# Patient Record
Sex: Female | Born: 1955
Health system: Southern US, Community
[De-identification: ages and names within clinical notes are randomized; demographics above are authoritative.]

## PROBLEM LIST (undated history)

## (undated) DIAGNOSIS — I10 Essential (primary) hypertension: Secondary | ICD-10-CM

---

## 1999-07-14 ENCOUNTER — Emergency Department (HOSPITAL_COMMUNITY): Admission: EM | Admit: 1999-07-14 | Discharge: 1999-07-14 | Payer: Self-pay | Admitting: Emergency Medicine

## 2000-06-23 ENCOUNTER — Emergency Department (HOSPITAL_COMMUNITY): Admission: EM | Admit: 2000-06-23 | Discharge: 2000-06-23 | Payer: Self-pay | Admitting: Emergency Medicine

## 2000-06-23 ENCOUNTER — Encounter: Payer: Self-pay | Admitting: Emergency Medicine

## 2006-09-16 ENCOUNTER — Emergency Department (HOSPITAL_COMMUNITY): Admission: EM | Admit: 2006-09-16 | Discharge: 2006-09-16 | Payer: Self-pay | Admitting: Emergency Medicine

## 2010-11-02 LAB — URIC ACID: Uric Acid, Serum: 5.9

## 2014-09-27 ENCOUNTER — Encounter (HOSPITAL_COMMUNITY): Payer: Self-pay | Admitting: Emergency Medicine

## 2014-09-27 ENCOUNTER — Emergency Department (HOSPITAL_COMMUNITY)
Admission: EM | Admit: 2014-09-27 | Discharge: 2014-09-27 | Disposition: A | Discharge status: Home / Self Care | Payer: Self-pay | Attending: Emergency Medicine | Admitting: Emergency Medicine

## 2014-09-27 DIAGNOSIS — R200 Anesthesia of skin: Secondary | ICD-10-CM | POA: Insufficient documentation

## 2014-09-27 DIAGNOSIS — K0889 Other specified disorders of teeth and supporting structures: Secondary | ICD-10-CM

## 2014-09-27 DIAGNOSIS — Z79899 Other long term (current) drug therapy: Secondary | ICD-10-CM | POA: Insufficient documentation

## 2014-09-27 DIAGNOSIS — I159 Secondary hypertension, unspecified: Secondary | ICD-10-CM | POA: Insufficient documentation

## 2014-09-27 DIAGNOSIS — K088 Other specified disorders of teeth and supporting structures: Secondary | ICD-10-CM | POA: Insufficient documentation

## 2014-09-27 DIAGNOSIS — Z720 Tobacco use: Secondary | ICD-10-CM | POA: Insufficient documentation

## 2014-09-27 DIAGNOSIS — K029 Dental caries, unspecified: Secondary | ICD-10-CM | POA: Insufficient documentation

## 2014-09-27 LAB — I-STAT CHEM 8, ED
BUN: 13 mg/dL (ref 6–20)
Calcium, Ion: 1.18 mmol/L (ref 1.12–1.23)
Chloride: 110 mmol/L (ref 101–111)
Creatinine, Ser: 0.8 mg/dL (ref 0.44–1.00)
Glucose, Bld: 96 mg/dL (ref 65–99)
HCT: 42 % (ref 36.0–46.0)
Hemoglobin: 14.3 g/dL (ref 12.0–15.0)
Potassium: 3.4 mmol/L — ABNORMAL LOW (ref 3.5–5.1)
Sodium: 143 mmol/L (ref 135–145)
TCO2: 19 mmol/L (ref 0–100)

## 2014-09-27 LAB — CBC WITH DIFFERENTIAL/PLATELET
Basophils Absolute: 0 K/uL (ref 0.0–0.1)
Basophils Relative: 0 % (ref 0–1)
Eosinophils Absolute: 0.2 K/uL (ref 0.0–0.7)
Eosinophils Relative: 3 % (ref 0–5)
HCT: 39.1 % (ref 36.0–46.0)
Hemoglobin: 12.7 g/dL (ref 12.0–15.0)
Lymphocytes Relative: 55 % — ABNORMAL HIGH (ref 12–46)
Lymphs Abs: 3.5 K/uL (ref 0.7–4.0)
MCH: 31.5 pg (ref 26.0–34.0)
MCHC: 32.5 g/dL (ref 30.0–36.0)
MCV: 97 fL (ref 78.0–100.0)
Monocytes Absolute: 0.4 K/uL (ref 0.1–1.0)
Monocytes Relative: 6 % (ref 3–12)
Neutro Abs: 2.3 K/uL (ref 1.7–7.7)
Neutrophils Relative %: 36 % — ABNORMAL LOW (ref 43–77)
Platelets: 259 K/uL (ref 150–400)
RBC: 4.03 MIL/uL (ref 3.87–5.11)
RDW: 14.7 % (ref 11.5–15.5)
WBC: 6.4 K/uL (ref 4.0–10.5)

## 2014-09-27 MED ORDER — AMLODIPINE BESYLATE 10 MG PO TABS: 10.0000 mg | ORAL_TABLET | Freq: Every day | ORAL | Status: DC

## 2014-09-27 MED ORDER — OXYCODONE-ACETAMINOPHEN 5-325 MG PO TABS
1.0000 | ORAL_TABLET | Freq: Once | ORAL | Status: AC
  Administered 2014-09-27: 1 via ORAL
  Filled 2014-09-27: qty 1

## 2014-09-27 MED ORDER — PENICILLIN V POTASSIUM 500 MG PO TABS: 500.0000 mg | ORAL_TABLET | Freq: Four times a day (QID) | ORAL | Status: AC

## 2014-09-27 MED ORDER — AMLODIPINE BESYLATE 5 MG PO TABS
10.0000 mg | ORAL_TABLET | Freq: Once | ORAL | Status: AC
  Administered 2014-09-27: 10 mg via ORAL
  Filled 2014-09-27: qty 2

## 2014-09-27 MED ORDER — TRAMADOL HCL 50 MG PO TABS: 50.0000 mg | ORAL_TABLET | Freq: Four times a day (QID) | ORAL | Status: DC | PRN

## 2014-09-27 NOTE — ED Provider Notes (Signed)
CSN: [id number]     Arrival date & time 09/27/14  1122 History  This chart was scribed for non-physician practitioner, Lottie Mussel, PA-C, working with Gilda Crease, MD, by Ronney Lion, ED Scribe. This patient was seen in room TR05C/TR05C and the patient's care was started at 11:41 AM.   Chief Complaint  Patient presents with  . Dental Pain  . Hypertension   The history is provided by the patient. No language interpreter was used.   HPI Comments: Rhonda Best is a 59 y.o. female who presents to the Emergency Department complaining of constant, severe, left lower dental pain that began 1 month ago and has been worsening since.   Regarding patient's blood pressure of 260/127, patient reports she has had a known history of hypertension for the past 30 but has not taken any medications since. She was prescribed Norvasc at that time, but states she did not continue to take them. Patient states she has not seen a doctor in over 20 years. She states she is a current smoker. She denies chest pain or SOB.   She notes numbness over her left shoulder intermittently for the past week.  History reviewed. No pertinent past medical history. History reviewed. No pertinent past surgical history. No family history on file. Social History  Substance Use Topics  . Smoking status: Current Every Day Smoker  . Smokeless tobacco: None  . Alcohol Use: Yes   OB History    No data available     Review of Systems  HENT: Positive for dental problem.   Respiratory: Negative for shortness of breath.   Cardiovascular: Negative for chest pain.  Neurological: Negative for headaches.  All other systems reviewed and are negative.  Allergies  Review of patient's allergies indicates no known allergies.  Home Medications   Prior to Admission medications   Not on File   BP 260/127 mmHg  Pulse 80  Temp(Src) 97.6 F (36.4 C) (Oral)  Resp 16  SpO2 100% Physical Exam  Constitutional: She  is oriented to person, place, and time. She appears well-developed and well-nourished. No distress.  HENT:  Head: Normocephalic and atraumatic.  Right lower first molar decay to the gumline, surrounding, swelling and erythema. No drainage. No obvious abscess. No trismus. A swelling under the tongue.  Eyes: Conjunctivae and EOM are normal.  Neck: Neck supple. No tracheal deviation present.  Cardiovascular: Normal rate, regular rhythm and normal heart sounds.   Pulmonary/Chest: Effort normal and breath sounds normal. No respiratory distress. She has no wheezes. She has no rales.  Musculoskeletal: Normal range of motion. She exhibits no edema.  Neurological: She is alert and oriented to person, place, and time.  Skin: Skin is warm and dry.  Psychiatric: She has a normal mood and affect. Her behavior is normal.  Nursing note and vitals reviewed.   ED Course  Procedures (including critical care time)  DIAGNOSTIC STUDIES: Oxygen Saturation is 100% on RA, normal by my interpretation.    COORDINATION OF CARE: 11:46 AM - Discussed treatment plan with pt at bedside which includes EKG and blood tests to evaluate her hypertension and begin hypertension medication therapy. Lengthy discussion with pt discussing the serious risks of leaving her hypertension uncontrolled, including the possibility of CVA. Will administer one dose of pain medication and hypertension medication here. Will also give Rx antibiotics and dental referral for dental pain. Pt verbalized understanding and agreed to plan.    Labs Review Labs Reviewed  CBC WITH DIFFERENTIAL/PLATELET -  Abnormal; Notable for the following:    Neutrophils Relative % 36 (*)    Lymphocytes Relative 55 (*)    All other components within normal limits  I-STAT CHEM 8, ED - Abnormal; Notable for the following:    Potassium 3.4 (*)    All other components within normal limits    Imaging Review No results found. I have personally reviewed and  evaluated these images and lab results as part of my medical decision-making.   EKG Interpretation None      MDM   Final diagnoses:  Pain, dental  Secondary hypertension, unspecified   Patient is an emergency department for dental pain. When vital signs obtained, showed the patient's blood pressure is 260/127. Rechecked, the same. Patient states she has not been to primary care doctor in 30 years. She states last time she went she was placed on Norvasc, but she states she never went back. Patient denies any associated symptoms, denies any chest pain, headache, swelling, shortness of breath, malaise. Her tooth does look like she might have an early infection. Will start on penicillin, tramadol for pain. I discussed patient's blood pressure Dr. Blinda Leatherwood. Will get CBC, metabolic panel EKG. Norvasc ordered 10 mg for blood pressure.  Patient's electrolytes, renal function, blood counts are unremarkable. Her EKG shows no acute findings. This is asymptomatic hypertension. No emergent treatment indicated. Rechecked blood pressure is 227/98. Discussed with Dr. Blinda Leatherwood again, patient will be discharged home, with Norvasc, and follow-up with primary care doctor. Resources provided. Return precautions discussed. Also discussed diet, and the importance of following up.  Filed Vitals:   09/27/14 1258  BP: 227/98  Pulse: 78  Temp: 98.2 F (36.8 C)  Resp: 16   I personally performed the services described in this documentation, which was scribed in my presence. The recorded information has been reviewed and is accurate.    Jaynie Crumble, PA-C 09/27/14 1638  Gilda Crease, MD 09/28/14 [phone]

## 2014-09-27 NOTE — ED Notes (Signed)
Left lower dental pain on and off x 1 month. Worse since yesterday.

## 2014-09-27 NOTE — Discharge Instructions (Signed)
Takes Norvasc daily for your high blood pressure. Please follow-up with the primary care doctor for recheck in 2 weeks. Watch your diet.  Take penicillin as prescribed until all gone for a dental infection. Tramadol for pain, he can also take Tylenol for additional pain relief. Please follow-up with a dentist.   Dental Pain A tooth ache may be caused by cavities (tooth decay). Cavities expose the nerve of the tooth to air and hot or cold temperatures. It may come from an infection or abscess (also called a boil or furuncle) around your tooth. It is also often caused by dental caries (tooth decay). This causes the pain you are having. DIAGNOSIS  Your caregiver can diagnose this problem by exam. TREATMENT   If caused by an infection, it may be treated with medications which kill germs (antibiotics) and pain medications as prescribed by your caregiver. Take medications as directed.  Only take over-the-counter or prescription medicines for pain, discomfort, or fever as directed by your caregiver.  Whether the tooth ache today is caused by infection or dental disease, you should see your dentist as soon as possible for further care. SEEK MEDICAL CARE IF: The exam and treatment you received today has been provided on an emergency basis only. This is not a substitute for complete medical or dental care. If your problem worsens or new problems (symptoms) appear, and you are unable to meet with your dentist, call or return to this location. SEEK IMMEDIATE MEDICAL CARE IF:   You have a fever.  You develop redness and swelling of your face, jaw, or neck.  You are unable to open your mouth.  You have severe pain uncontrolled by pain medicine. MAKE SURE YOU:   Understand these instructions.  Will watch your condition.  Will get help right away if you are not doing well or get worse. Document Released: 01/07/2005 Document Revised: 04/01/2011 Document Reviewed: 08/26/2007 [facility] Patient  Information 2015 [place], [place]. This information is not intended to replace advice given to you by your health care provider. Make sure you discuss any questions you have with your health care provider.  Hypertension Hypertension, commonly called high blood pressure, is when the force of blood pumping through your arteries is too strong. Your arteries are the blood vessels that carry blood from your heart throughout your body. A blood pressure reading consists of a higher number over a lower number, such as 110/72. The higher number (systolic) is the pressure inside your arteries when your heart pumps. The lower number (diastolic) is the pressure inside your arteries when your heart relaxes. Ideally you want your blood pressure below 120/80. Hypertension forces your heart to work harder to pump blood. Your arteries may become narrow or stiff. Having hypertension puts you at risk for heart disease, stroke, and other problems.  RISK FACTORS Some risk factors for high blood pressure are controllable. Others are not.  Risk factors you cannot control include:   Race. You may be at higher risk if you are African American.  Age. Risk increases with age.  Gender. Men are at higher risk than women before age 3 years. After age 48, women are at higher risk than men. Risk factors you can control include:  Not getting enough exercise or physical activity.  Being overweight.  Getting too much fat, sugar, calories, or salt in your diet.  Drinking too much alcohol. SIGNS AND SYMPTOMS Hypertension does not usually cause signs or symptoms. Extremely high blood pressure (hypertensive crisis) may cause headache,  anxiety, shortness of breath, and nosebleed. DIAGNOSIS  To check if you have hypertension, your health care provider will measure your blood pressure while you are seated, with your arm held at the level of your heart. It should be measured at least twice using the same arm. Certain conditions can  cause a difference in blood pressure between your right and left arms. A blood pressure reading that is higher than normal on one occasion does not mean that you need treatment. If one blood pressure reading is high, ask your health care provider about having it checked again. TREATMENT  Treating high blood pressure includes making lifestyle changes and possibly taking medicine. Living a healthy lifestyle can help lower high blood pressure. You may need to change some of your habits. Lifestyle changes may include:  Following the DASH diet. This diet is high in fruits, vegetables, and whole grains. It is low in salt, red meat, and added sugars.  Getting at least 2 hours of brisk physical activity every week.  Losing weight if necessary.  Not smoking.  Limiting alcoholic beverages.  Learning ways to reduce stress. If lifestyle changes are not enough to get your blood pressure under control, your health care provider may prescribe medicine. You may need to take more than one. Work closely with your health care provider to understand the risks and benefits. HOME CARE INSTRUCTIONS  Have your blood pressure rechecked as directed by your health care provider.   Take medicines only as directed by your health care provider. Follow the directions carefully. Blood pressure medicines must be taken as prescribed. The medicine does not work as well when you skip doses. Skipping doses also puts you at risk for problems.   Do not smoke.   Monitor your blood pressure at home as directed by your health care provider. SEEK MEDICAL CARE IF:   You think you are having a reaction to medicines taken.  You have recurrent headaches or feel dizzy.  You have swelling in your ankles.  You have trouble with your vision. SEEK IMMEDIATE MEDICAL CARE IF:  You develop a severe headache or confusion.  You have unusual weakness, numbness, or feel faint.  You have severe chest or abdominal pain.  You  vomit repeatedly.  You have trouble breathing. MAKE SURE YOU:   Understand these instructions.  Will watch your condition.  Will get help right away if you are not doing well or get worse. Document Released: 01/07/2005 Document Revised: 05/24/2013 Document Reviewed: 10/30/2012 [facility] Patient Information 2015 [place], [place]. This information is not intended to replace advice given to you by your health care provider. Make sure you discuss any questions you have with your health care provider.  DASH Eating Plan DASH stands for "Dietary Approaches to Stop Hypertension." The DASH eating plan is a healthy eating plan that has been shown to reduce high blood pressure (hypertension). Additional health benefits may include reducing the risk of type 2 diabetes mellitus, heart disease, and stroke. The DASH eating plan may also help with weight loss. WHAT DO I NEED TO KNOW ABOUT THE DASH EATING PLAN? For the DASH eating plan, you will follow these general guidelines:  Choose foods with a percent daily value for sodium of less than 5% (as listed on the food label).  Use salt-free seasonings or herbs instead of table salt or sea salt.  Check with your health care provider or pharmacist before using salt substitutes.  Eat lower-sodium products, often labeled as "lower sodium" or "no  salt added."  Eat fresh foods.  Eat more vegetables, fruits, and low-fat dairy products.  Choose whole grains. Look for the word "whole" as the first word in the ingredient list.  Choose fish and skinless chicken or [place] more often than red meat. Limit fish, poultry, and meat to 6 oz (170 g) each day.  Limit sweets, desserts, sugars, and sugary drinks.  Choose heart-healthy fats.  Limit cheese to 1 oz (28 g) per day.  Eat more home-cooked food and less restaurant, buffet, and fast food.  Limit fried foods.  Cook foods using methods other than frying.  Limit canned vegetables. If you do use them, rinse  them well to decrease the sodium.  When eating at a restaurant, ask that your food be prepared with less salt, or no salt if possible. WHAT FOODS CAN I EAT? Seek help from a dietitian for individual calorie needs. Grains Whole grain or whole wheat bread. Brown rice. Whole grain or whole wheat pasta. Quinoa, bulgur, and whole grain cereals. Low-sodium cereals. Corn or whole wheat flour tortillas. Whole grain cornbread. Whole grain crackers. Low-sodium crackers. Vegetables Fresh or frozen vegetables (raw, steamed, roasted, or grilled). Low-sodium or reduced-sodium tomato and vegetable juices. Low-sodium or reduced-sodium tomato sauce and paste. Low-sodium or reduced-sodium canned vegetables.  Fruits All fresh, canned (in natural juice), or frozen fruits. Meat and Other Protein Products Ground beef (85% or leaner), grass-fed beef, or beef trimmed of fat. Skinless chicken or [place]. Ground chicken or [place]. Pork trimmed of fat. All fish and seafood. Eggs. Dried beans, peas, or lentils. Unsalted nuts and seeds. Unsalted canned beans. Dairy Low-fat dairy products, such as skim or 1% milk, 2% or reduced-fat cheeses, low-fat ricotta or cottage cheese, or plain low-fat yogurt. Low-sodium or reduced-sodium cheeses. Fats and Oils Tub margarines without trans fats. Light or reduced-fat mayonnaise and salad dressings (reduced sodium). Avocado. Safflower, olive, or canola oils. Natural peanut or almond butter. Other Unsalted popcorn and pretzels. The items listed above may not be a complete list of recommended foods or beverages. Contact your dietitian for more options. WHAT FOODS ARE NOT RECOMMENDED? Grains White bread. White pasta. White rice. Refined cornbread. Bagels and croissants. Crackers that contain trans fat. Vegetables Creamed or fried vegetables. Vegetables in a cheese sauce. Regular canned vegetables. Regular canned tomato sauce and paste. Regular tomato and vegetable juices. Fruits Dried  fruits. Canned fruit in light or heavy syrup. Fruit juice. Meat and Other Protein Products Fatty cuts of meat. Ribs, chicken wings, bacon, sausage, bologna, salami, chitterlings, fatback, hot dogs, bratwurst, and packaged luncheon meats. Salted nuts and seeds. Canned beans with salt. Dairy Whole or 2% milk, cream, half-and-half, and cream cheese. Whole-fat or sweetened yogurt. Full-fat cheeses or blue cheese. Nondairy creamers and whipped toppings. Processed cheese, cheese spreads, or cheese curds. Condiments Onion and garlic salt, seasoned salt, table salt, and sea salt. Canned and packaged gravies. Worcestershire sauce. Tartar sauce. Barbecue sauce. Teriyaki sauce. Soy sauce, including reduced sodium. Steak sauce. Fish sauce. Oyster sauce. Cocktail sauce. Horseradish. Ketchup and mustard. Meat flavorings and tenderizers. Bouillon cubes. Hot sauce. Tabasco sauce. Marinades. Taco seasonings. Relishes. Fats and Oils Butter, stick margarine, lard, shortening, ghee, and bacon fat. Coconut, palm kernel, or palm oils. Regular salad dressings. Other Pickles and olives. Salted popcorn and pretzels. The items listed above may not be a complete list of foods and beverages to avoid. Contact your dietitian for more information. WHERE CAN I FIND MORE INFORMATION? National Heart, Lung, and Blood Institute: [employer].it  Document Released: 12/27/2010 Document Revised: 05/24/2013 Document Reviewed: 11/11/2012 [facility] Patient Information 2015 [place], [place]. This information is not intended to replace advice given to you by your health care provider. Make sure you discuss any questions you have with your health care provider.   Emergency Department Resource Guide 1) Find a Doctor and Pay Out of Pocket Although you won't have to find out who is covered by your insurance plan, it is a good idea to ask around and get recommendations. You will then need to call the office and  see if the doctor you have chosen will accept you as a new patient and what types of options they offer for patients who are self-pay. Some doctors offer discounts or will set up payment plans for their patients who do not have insurance, but you will need to ask so you aren't surprised when you get to your appointment.  2) Contact Your Local Health Department Not all health departments have doctors that can see patients for sick visits, but many do, so it is worth a call to see if yours does. If you don't know where your local health department is, you can check in your phone book. The CDC also has a tool to help you locate your state's health department, and many state websites also have listings of all of their local health departments.  3) Find a Walk-in Clinic If your illness is not likely to be very severe or complicated, you may want to try a walk in clinic. These are popping up all over the country in pharmacies, drugstores, and shopping centers. They're usually staffed by nurse practitioners or physician assistants that have been trained to treat common illnesses and complaints. They're usually fairly quick and inexpensive. However, if you have serious medical issues or chronic medical problems, these are probably not your best option.  No Primary Care Doctor: - Call Health Connect at  [phone] - they can help you locate a primary care doctor that  accepts your insurance, provides certain services, etc. - Physician Referral Service- [phone]  Chronic Pain Problems: Organization         Address  Phone   Notes  [facility]  [phone] Patients need to be referred by their primary care doctor.   Medication Assistance: Organization         Address  Phone   Notes  [facility] Medication [facility] [address] [place], [place] [address] [phone] --Must be a resident of [facility] -- Must have NO insurance coverage whatsoever  (no Medicaid/ Medicare, etc.) -- The pt. MUST have a primary care doctor that directs their care regularly and follows them in the community   [facility]ist  [phone]   [employer]  [phone]    Agencies that provide inexpensive medical care: Organization         Address  Phone   Notes  [facility]  [phone]   [facility]    [phone]   [facility] [address] [place], [place] [address] [phone]   [facility] [address] [place], [place] [phone]   Planned Parenthood    [phone]   [facility]    [phone]   Community [facility] and [facility]  [address],  Phone:  [phone], Fax:  [phone] Hours of Operation:  9 am - 6 pm, M-F.  Also accepts Medicaid/Medicare and self-pay.  [facility]  [address], [place] Phone: [phone], Fax: [phone]. Hours of Operation:  8:30 am - 5:30 pm, M-F.  Also accepts Medicaid and self-pay.  [facility] [place] [address] [place] Phone: [phone]   [facility] [address] Natasha Bence [place], [place] [phone], Ext. [phone] Mondays & Thursdays: 7-9 AM.  First 15 patients are seen on a first come, first serve basis.    Medicaid-accepting [facility] Providers:  Organization         Address  Phone   Notes  [facility] [address], [place] [phone] Also accepts self-pay patients.  [facility] [address] Laurell Josephs [place], [place]  [phone]   [facility] [address], [place] [phone]   [facility] [address] [place] [phone]   Renaye Rakers [address], [place]   [phone] Only accepts [place] Access [place] patients after they have their name applied to their  card.   Self-Pay (no insurance) in [facility]:  Organization         Address  Phone   Notes  Sickle Cell Patients, [facility] [address] [place], [place] [phone]   [facility] [address] [place], [place] [phone]   [facility] [place]  [address], [place] [phone]   [facility]/Dr. Osei-Bonsu  [address], [place] or [address], [place] [phone] Phone number for both [place] and [place] locations is the same.  [facility]gent [facility] and [facility] [address] [place] [phone]   [facility] [address] [place] or [address] [phone]   [facility] [address] [place] [phone], phone; [phone], fax Sees patients 1st and 3rd Saturday of every month.  Must not qualify for public or private insurance (i.e. Medicaid, Medicare, [place] Health Choice, Veterans' Benefits)  Household income should be no more than 200% of the poverty level The clinic cannot treat you if you are pregnant or think you are pregnant  Sexually transmitted diseases are not treated at the clinic.    Dental Care: Organization         Address  Phone  Notes  [facility] [place] Department of [facility] [address] [place], [place] [phone] Accepts children up to age 62 who are enrolled in [place] or [place] Health Choice; pregnant women with a Medicaid card; and children who have applied for Medicaid or [place] Health Choice, but were declined, whose parents can pay a reduced fee at time of service.  [facility] Department of [facility]  [address], [place] [phone] Accepts children up to age 65 who are enrolled in [place] or [place] Health Choice; pregnant women with a Medicaid card; and children who have applied for Medicaid or [place] Health  Choice, but were declined, whose parents can pay a reduced fee at time of service.  [facility]  [address] [place], [place] [phone] Patients are seen by appointment only. Walk-ins are not accepted. [facility] will see patients 76 years of age and older. Monday - Tuesday (  8am-5pm) Most Wednesdays (8:30-5pm) $30 per visit, cash only  [facility] Access [facility]  [address], [facility] [phone] Patients are seen by appointment only. Walk-ins are not accepted. [facility] will see patients 62 years of age and older. One Wednesday Evening (Monthly: Volunteer Based).  $30 per visit, cash only  [employer]  [phone] for adults; Children under age 37, call Graduate Pediatric Dentistry at [phone]. Children aged 8-14, please call [phone] to request a pediatric application.  Dental services are provided in all areas of dental care including fillings, crowns and bridges, complete and partial dentures, implants, gum treatment, root canals, and extractions. Preventive care is also provided. Treatment is provided to both adults and children. Patients are selected via a lottery and there is often a waiting list.   [facility] [address], [place]  [phone] [email or web address]   [facility] [address] [place], [place] [phone], Ext. [phone] Second and Fourth Thursday of each month, opens at 6:30 AM; Clinic ends at 9 AM.  Patients are seen on a first-come first-served basis, and a limited number are seen during each clinic.   [facility]  [address] Ether Griffins [place], [place] [phone]   Eligibility Requirements You must have lived in [place], [place], or [place] counties for at least the last three months.   You cannot be eligible for state or federal sponsored [place], including [employer], [place], or [employer].   You  generally cannot be eligible for healthcare insurance through your employer.    How to apply: Eligibility screenings are held every Tuesday and Wednesday afternoon from 1:00 pm until 4:00 pm. You do not need an appointment for the interview!  [facility] [address], [place], [place] [phone]   [facility] Health Department  [phone]   [facility] Health Department  [phone]   [facility] Health Department  [phone]    Behavioral Health Resources in the Community: Intensive Outpatient Programs Organization         Address  Phone  Notes  [facility] [address] [place], [place] [phone]   [facility] [address], [place], [place] [phone]   [facility] [address], [place], [place]  [phone]   [facility] Mental Health [address],  [place], [place] [phone] or [phone]   Substance Abuse Resources Organization         Address  Phone  Notes  [facility] Drug Services  [phone]   [facility]  [phone]   [facility]  [phone]   Floydene Flock  [phone]   Residential & Outpatient Substance Abuse Program  [phone]   Psychological Services Organization         Address  Phone  Notes  [facility]  [phone]   [facility]  [phone]   [facility] Mental Health [address], [place] [phone] or [phone]    Mobile Crisis Teams Organization         Address  Phone  Notes  [facility], Mobile Crisis Care Unit  [phone]   [facility]  [address] [place], [place] [phone]   Doristine Locks [address] [place] [phone]    Self-Help/Support Groups Organization         Address  Phone  Notes  [facility] [place] - variety of support groups  336- I7437963 Call for  more information  Narcotics Anonymous (NA), [facility] [address], [employer] [place]  2 meetings at this location   Statistician         Address  Phone  Notes  [facility] [address],    [place]  [phone]   [facility]  [address], [place] [address], [place], [place] [phone]   [facility] [address] [place], [place] [phone] Admissions: 8am-3pm M-F  [facility] [address]    [place], [place] [phone]   [facility] [address] [place], [place], [place] [phone]   [facility] [address],  [place], [place] [phone]   [facility] - Intensive Outpatient [address] Laurell Josephs [address], [place], [place] [phone]   [facility] ([facility] [address] [place]  [place], [place] [phone] or [phone]   [facility] ([facility]) [address], [place], [place] [phone] Accepts Medicaid  [facility] [place] [address]  [place] [phone] Substance Abuse/Addiction Treatment   [facility] Organization         Address  Phone  Notes  [facility]  9043624561   Angie Fava, PhD [address] Ervin Knack [place], [place]   [phone] or [phone]   [facility]   [address] [place], [place] [phone]   [facility] [address] [place], [place] [phone] Insurance/Medicaid/sponsorship through [facility] [address]                                    [place], [place] [phone] Therapy/tele-psych/case  [facility] [address], [place] [phone]    Dr. Lolly [place]  [id number]   [facility]  [employer] [facility] Dept. 1) [address] [place] 2) [address], [place] 3)  [address], [place] [phone]  [phone]   [facility] Child Abuse Hotline [phone] or [phone] (After Hours)

## 2015-06-05 ENCOUNTER — Emergency Department (HOSPITAL_COMMUNITY): Payer: Self-pay

## 2015-06-05 ENCOUNTER — Emergency Department (HOSPITAL_COMMUNITY)
Admission: EM | Admit: 2015-06-05 | Discharge: 2015-06-06 | Disposition: A | Discharge status: Home / Self Care | Payer: Self-pay | Attending: Emergency Medicine | Admitting: Emergency Medicine

## 2015-06-05 DIAGNOSIS — M25472 Effusion, left ankle: Secondary | ICD-10-CM | POA: Insufficient documentation

## 2015-06-05 DIAGNOSIS — R200 Anesthesia of skin: Secondary | ICD-10-CM | POA: Insufficient documentation

## 2015-06-05 DIAGNOSIS — F17200 Nicotine dependence, unspecified, uncomplicated: Secondary | ICD-10-CM | POA: Insufficient documentation

## 2015-06-05 DIAGNOSIS — M79662 Pain in left lower leg: Secondary | ICD-10-CM | POA: Insufficient documentation

## 2015-06-05 DIAGNOSIS — M7989 Other specified soft tissue disorders: Secondary | ICD-10-CM | POA: Insufficient documentation

## 2015-06-05 DIAGNOSIS — Z79899 Other long term (current) drug therapy: Secondary | ICD-10-CM | POA: Insufficient documentation

## 2015-06-05 DIAGNOSIS — R600 Localized edema: Secondary | ICD-10-CM | POA: Insufficient documentation

## 2015-06-05 DIAGNOSIS — M79672 Pain in left foot: Secondary | ICD-10-CM | POA: Insufficient documentation

## 2015-06-05 LAB — CBC WITH DIFFERENTIAL/PLATELET
Basophils Absolute: 0 10*3/uL (ref 0.0–0.1)
Basophils Relative: 0 %
EOS PCT: 4 %
Eosinophils Absolute: 0.2 10*3/uL (ref 0.0–0.7)
HCT: 39 % (ref 36.0–46.0)
Hemoglobin: 13 g/dL (ref 12.0–15.0)
LYMPHS ABS: 4 10*3/uL (ref 0.7–4.0)
LYMPHS PCT: 69 %
MCH: 31.7 pg (ref 26.0–34.0)
MCHC: 33.3 g/dL (ref 30.0–36.0)
MCV: 95.1 fL (ref 78.0–100.0)
Monocytes Absolute: 0.4 10*3/uL (ref 0.1–1.0)
Monocytes Relative: 7 %
Neutro Abs: 1.1 10*3/uL — ABNORMAL LOW (ref 1.7–7.7)
Neutrophils Relative %: 20 %
PLATELETS: 290 10*3/uL (ref 150–400)
RBC: 4.1 MIL/uL (ref 3.87–5.11)
RDW: 15.8 % — ABNORMAL HIGH (ref 11.5–15.5)
WBC: 5.7 10*3/uL (ref 4.0–10.5)

## 2015-06-05 LAB — BASIC METABOLIC PANEL
Anion gap: 9 (ref 5–15)
BUN: 14 mg/dL (ref 6–20)
CHLORIDE: 109 mmol/L (ref 101–111)
CO2: 22 mmol/L (ref 22–32)
Calcium: 9 mg/dL (ref 8.9–10.3)
Creatinine, Ser: 0.89 mg/dL (ref 0.44–1.00)
GFR calc Af Amer: >60 mL/min (ref >60)
GFR calc non Af Amer: >60 mL/min (ref >60)
GLUCOSE: 89 mg/dL (ref 65–99)
POTASSIUM: 3.6 mmol/L (ref 3.5–5.1)
SODIUM: 140 mmol/L (ref 135–145)

## 2015-06-05 LAB — D-DIMER, QUANTITATIVE: D-Dimer, Quant: <0.27 ug/mL-FEU (ref 0.00–0.50)

## 2015-06-05 NOTE — ED Provider Notes (Signed)
CSN: [id number]     Arrival date & time 06/05/15  1922 History  By signing my name below, I, Rhonda Best, attest that this documentation has been prepared under the direction and in the presence of non-physician practitioner, Felicie Mornavid Aldene Hendon, NP. Electronically Signed: Linna Darnerussell Best, Scribe. 06/05/2015. 8:37 PM.   Chief Complaint  Patient presents with  . Ankle Pain    Patient is a 60 y.o. female presenting with ankle pain. The history is provided by the patient. No language interpreter was used.  Ankle Pain Location:  Ankle and foot Time since incident:  3 weeks Injury: no   Ankle location:  L ankle Foot location:  L foot and dorsum of L foot Pain details:    Severity:  Severe   Onset quality:  Sudden   Duration:  3 weeks   Timing:  Constant   Progression:  Worsening Chronicity:  New Dislocation: no   Foreign body present:  No foreign bodies Tetanus status:  Unknown Prior injury to area:  No Worsened by:  Bearing weight Associated symptoms: swelling   Associated symptoms: no neck pain      HPI Comments: Rhonda Best is a 60 y.o. female with no significant PMHx who presents to the Emergency Department complaining of constant, severe, worsening, left ankle pain and swelling for the last three weeks. Pt reports that she worked for 22 days straight and her symptoms presented afterwards; she is on her feet often for work. Pt also endorses left foot pain and swelling since onset. Pt reports that her swelling reduces minimally at night. She endorses severe pain exacerbation with ambulation, weight bearing, and standing; she states that for the last three days she has experienced significant pain throughout her lower extremities bilaterally upon standing. She denies recent injury. Pt denies pain exacerbation with palpation to her left foot or left ankle. She further denies pain at the bottom of her left foot, neuro deficits, or any other associated symptoms  Pt additionally notes  sudden onset, intermittent, numbness localized in her upper trapezius for the last few days. She denies weakness in her bilateral arms, neck pain, or any other associated symptoms.  No past medical history on file. No past surgical history on file. No family history on file. Social History  Substance Use Topics  . Smoking status: Current Every Day Smoker  . Smokeless tobacco: Not on file  . Alcohol Use: Yes   OB History    No data available     Review of Systems  Musculoskeletal: Positive for joint swelling (left foot, left ankle) and arthralgias (left foot, left ankle). Negative for neck pain.  Neurological: Positive for numbness (upper trapezius). Negative for weakness.  All other systems reviewed and are negative.   Allergies  Review of patient's allergies indicates no known allergies.  Home Medications   Prior to Admission medications   Medication Sig Start Date End Date Taking? Authorizing Provider  amLODipine (NORVASC) 10 MG tablet Take 1 tablet (10 mg total) by mouth daily. 09/27/14   Tatyana Kirichenko, PA-C  traMADol (ULTRAM) 50 MG tablet Take 1 tablet (50 mg total) by mouth every 6 (six) hours as needed. 09/27/14   Tatyana Kirichenko, PA-C   BP 185/95 mmHg  Pulse 75  Temp(Src) 98.3 F (36.8 C) (Oral)  Resp 16  Ht 5\' 3"  (1.6 m)  Wt 178 lb (80.74 kg)  BMI 31.54 kg/m2  SpO2 97% Physical Exam  Constitutional: She is oriented to person, place, and time. She appears well-developed  and well-nourished. No distress.  HENT:  Head: Normocephalic and atraumatic.  Eyes: Conjunctivae and EOM are normal.  Neck: Neck supple. No tracheal deviation present.  Cardiovascular: Normal rate.   Pulmonary/Chest: Effort normal. No respiratory distress.  Musculoskeletal:  Swelling to left foot and ankle.  Tenderness at the base of the left calf.  Neurological: She is alert and oriented to person, place, and time.  Skin: Skin is warm and dry.  Psychiatric: She has a normal mood and  affect. Her behavior is normal.  Nursing note and vitals reviewed.   ED Course  Procedures (including critical care time)  DIAGNOSTIC STUDIES: Oxygen Saturation is 97% on RA, normal by my interpretation.    COORDINATION OF CARE: 8:37 PM Discussed treatment plan with pt at bedside and pt agreed to plan.  Labs Review Labs Reviewed  CBC WITH DIFFERENTIAL/PLATELET - Abnormal; Notable for the following:    RDW 15.8 (*)    Neutro Abs 1.1 (*)    All other components within normal limits  D-DIMER, QUANTITATIVE (NOT AT [facility])  BASIC METABOLIC PANEL    Imaging Review Dg Foot Complete Left  06/06/2015  CLINICAL DATA:  Left dorsal foot pain X 2 months. No specific injury, pt states she has worked 20 days straight and been on her feet too much. EXAM: LEFT FOOT - COMPLETE 3+ VIEW COMPARISON:  None. FINDINGS: There is no evidence of fracture or dislocation. There is no evidence of arthropathy or other focal bone abnormality. Soft tissues are unremarkable. IMPRESSION: Negative. Electronically Signed   By: Burman Nieves M.D.   On: 06/06/2015 00:17   I have personally reviewed and evaluated these images and lab results as part of my medical decision-making.   EKG Interpretation None     Radiology and lab results reviewed and shared with patient. MDM   Final diagnoses:  None  Left foot edema and pain. Negative d-dimer. Care instructions provided. Return precautions discussed.  I personally performed the services described in this documentation, which was scribed in my presence. The recorded information has been reviewed and is accurate.   Felicie Morn, NP 06/06/15 0200  Benjiman Core, MD 06/06/15 2352

## 2015-06-05 NOTE — ED Notes (Signed)
Ambulatory to triage. Pt states on and off swelling and pain in the left ankle. She says she worked 22 days straight and it started hurting after that. Denies specific injury.

## 2015-06-06 MED ORDER — NAPROXEN 500 MG PO TABS: 500.0000 mg | ORAL_TABLET | Freq: Two times a day (BID) | ORAL | Status: DC

## 2015-06-06 MED ORDER — MEDICAL COMPRESSION STOCKINGS MISC: 1.0000 | Freq: Every day | Status: DC

## 2015-06-06 NOTE — Discharge Instructions (Signed)
Edema Edema is an abnormal buildup of fluids. It is more common in your legs and thighs. Painless swelling of the feet and ankles is more likely as a person ages. It also is common in looser skin, like around your eyes. HOME CARE   Keep the affected body part above the level of the heart while lying down.  Do not sit still or stand for a long time.  Do not put anything right under your knees when you lie down.  Do not wear tight clothes on your upper legs.  Exercise your legs to help the puffiness (swelling) go down.  Wear elastic bandages or support stockings as told by your doctor.  A low-salt diet may help lessen the puffiness.  Only take medicine as told by your doctor. GET HELP IF:  Treatment is not working.  You have heart, liver, or kidney disease and notice that your skin looks puffy or shiny.  You have puffiness in your legs that does not get better when you raise your legs.  You have sudden weight gain for no reason. GET HELP RIGHT AWAY IF:   You have shortness of breath or chest pain.  You cannot breathe when you lie down.  You have pain, redness, or warmth in the areas that are puffy.  You have heart, liver, or kidney disease and get edema all of a sudden.  You have a fever and your symptoms get worse all of a sudden. MAKE SURE YOU:   Understand these instructions.  Will watch your condition.  Will get help right away if you are not doing well or get worse.   This information is not intended to replace advice given to you by your health care provider. Make sure you discuss any questions you have with your health care provider.   Document Released: 06/26/2007 Document Revised: 01/12/2013 Document Reviewed: 10/30/2012 Elsevier Interactive Patient Education 2016 Elsevier Inc.   PLEASE OBTAIN A PAIR OF COMPRESSION STOCKINGS AT THE PHARMACY.

## 2015-10-12 ENCOUNTER — Emergency Department (HOSPITAL_COMMUNITY)
Admission: EM | Admit: 2015-10-12 | Discharge: 2015-10-12 | Disposition: A | Discharge status: Home / Self Care | Payer: Self-pay | Attending: Emergency Medicine | Admitting: Emergency Medicine

## 2015-10-12 ENCOUNTER — Encounter (HOSPITAL_COMMUNITY): Payer: Self-pay | Admitting: Emergency Medicine

## 2015-10-12 DIAGNOSIS — M25572 Pain in left ankle and joints of left foot: Secondary | ICD-10-CM | POA: Insufficient documentation

## 2015-10-12 DIAGNOSIS — Y939 Activity, unspecified: Secondary | ICD-10-CM | POA: Insufficient documentation

## 2015-10-12 DIAGNOSIS — F17200 Nicotine dependence, unspecified, uncomplicated: Secondary | ICD-10-CM | POA: Insufficient documentation

## 2015-10-12 DIAGNOSIS — Y929 Unspecified place or not applicable: Secondary | ICD-10-CM | POA: Insufficient documentation

## 2015-10-12 DIAGNOSIS — I10 Essential (primary) hypertension: Secondary | ICD-10-CM | POA: Insufficient documentation

## 2015-10-12 DIAGNOSIS — X509XXA Other and unspecified overexertion or strenuous movements or postures, initial encounter: Secondary | ICD-10-CM | POA: Insufficient documentation

## 2015-10-12 DIAGNOSIS — Y999 Unspecified external cause status: Secondary | ICD-10-CM | POA: Insufficient documentation

## 2015-10-12 HISTORY — DX: Essential (primary) hypertension: I10

## 2015-10-12 MED ORDER — SODIUM CHLORIDE 0.9 % IV BOLUS (SEPSIS): 30.0000 mL/kg | Freq: Once | INTRAVENOUS | Status: DC

## 2015-10-12 MED ORDER — AMLODIPINE BESYLATE 10 MG PO TABS: 10.0000 mg | ORAL_TABLET | Freq: Every day | ORAL | 2 refills | Status: DC

## 2015-10-12 NOTE — ED Provider Notes (Signed)
[facility] DEPT Provider Note   CSN: [id number] Arrival date & time: 10/12/15  4098     History   Chief Complaint Chief Complaint  Patient presents with  . Joint Swelling    HPI Rhonda Best is a 60 y.o. female.  HPI   60 year old female presents today with left ankle pain. Patient was originally seen on 06/05/2015 approximately 4 months ago for left ankle pain. She reports that she had been working 21 days straight had swelling to the medial aspect of the left ankle, pain with ambulation. Patient was discharged home at that time with musculoskeletal pain. She reports that since then she has continued to work very consistently, notices that swelling gets worse after long. It working, improved with rest and elevation. She denies any fever, warmth to touch, decreased range of motion. She reports she is able to flex and dorsiflex the ankle without significant pain. Patient reports she's feeling otherwise well. She reports history of hypertension, but has run out of her Norvasc 10 mg. She denies any headache, changes in color clarity or characteristics of her urine, chest pain, neurological deficits. Stenting to follow-up with [facility] but was unable to get an appointment. Patient denies any other chronic medical history.      Past Medical History:  Diagnosis Date  . Hypertension     There are no active problems to display for this patient.   History reviewed. No pertinent surgical history.  OB History    No data available       Home Medications    Prior to Admission medications   Medication Sig Start Date End Date Taking? Authorizing Provider  acetaminophen (TYLENOL) 325 MG tablet Take 650 mg by mouth every 6 (six) hours as needed for moderate pain or headache.   Yes Historical Provider, MD  amLODipine (NORVASC) 10 MG tablet Take 1 tablet (10 mg total) by mouth daily. 10/12/15   Eyvonne Mechanic, PA-C  Elastic Bandages & Supports (MEDICAL COMPRESSION  STOCKINGS) MISC 1 Package by Does not apply route daily. Patient not taking: Reported on 10/12/2015 06/06/15   Felicie Morn, NP  naproxen (NAPROSYN) 500 MG tablet Take 1 tablet (500 mg total) by mouth 2 (two) times daily. Patient not taking: Reported on 10/12/2015 06/06/15   Felicie Morn, NP  traMADol (ULTRAM) 50 MG tablet Take 1 tablet (50 mg total) by mouth every 6 (six) hours as needed. Patient not taking: Reported on 10/12/2015 09/27/14   Jaynie Crumble, PA-C    Family History No family history on file.  Social History Social History  Substance Use Topics  . Smoking status: Current Every Day Smoker  . Smokeless tobacco: Never Used  . Alcohol use Yes     Comment: occasionally     Allergies   Review of patient's allergies indicates no known allergies.   Review of Systems Review of Systems  All other systems reviewed and are negative.   Physical Exam Updated Vital Signs BP (!) 211/100 (BP Location: Right Arm) Comment: RN made aware  Pulse 64   Temp 98.2 F (36.8 C) (Oral)   Resp 18   SpO2 100%   Physical Exam  Constitutional: She is oriented to person, place, and time. She appears well-developed and well-nourished.  HENT:  Head: Normocephalic and atraumatic.  Eyes: Conjunctivae are normal. Pupils are equal, round, and reactive to light. Right eye exhibits no discharge. Left eye exhibits no discharge. No scleral icterus.  Neck: Normal range of motion. No JVD present. No  tracheal deviation present.  Pulmonary/Chest: Effort normal. No stridor.  Musculoskeletal:  Obvious swelling to the left ankle, no warmth to touch, full active range of motion, pedal pulses 2+, sensation intact. No redness.  Neurological: She is alert and oriented to person, place, and time. Coordination normal.  Psychiatric: She has a normal mood and affect. Her behavior is normal. Judgment and thought content normal.  Nursing note and vitals reviewed.    ED Treatments / Results  Labs (all labs  ordered are listed, but only abnormal results are displayed) Labs Reviewed - No data to display  EKG  EKG Interpretation None       Radiology No results found.  Procedures Procedures (including critical care time)  Medications Ordered in ED Medications - No data to display   Initial Impression / Assessment and Plan / ED Course  I have reviewed the triage vital signs and the nursing notes.  Pertinent labs & imaging results that were available during my care of the patient were reviewed by me and considered in my medical decision making (see chart for details).  Clinical Course     Final Clinical Impressions(s) / ED Diagnoses   Final diagnoses:  Ankle pain, left  Essential hypertension   Labs:  Imaging:  Consults:  Therapeutics:  Discharge Meds:   Assessment/Plan: 60 year old female presents today with overuse injury to left ankle. She has no signs of infectious etiology on exam, full active range of motion, swelling improved with rest and elevation. Patient will be placed in an ASO, given work note, and instructed to decrease hours at work to help improvement. Patient also presents with hypertension here. She has no complaints, no signs of end organ damage, no concerning characteristics to the further evaluation and management here in the ED. Patient will be given a prescription for her Norvasc 10 mg. Case management consult that he would help arrange outpatient follow-up for hypertension. Patient was given strict return precautions, she verbalized understanding and agreement to today's plan had no further questions or concerns at time of discharge      New Prescriptions Discharge Medication List as of 10/12/2015 11:50 AM       Eyvonne MechanicJeffrey Yul Diana, PA-C 10/12/15 2045    Doug SouSam Jacubowitz, MD 10/13/15 1626

## 2015-10-12 NOTE — Discharge Instructions (Signed)
Please follow-up with [facility] and wellness in the sickle cell clinic for further evaluation and management of your hypertension. Please take medication as directed. He is return the emergency room immediately if he experiences any new or worsening signs or symptoms

## 2015-10-12 NOTE — ED Triage Notes (Signed)
Pt here as a return visit with c/o left ankle swelling-- was seen here for same, referred to health and wellness center-- has not been able to get an appt. Ankle is swollen, no injury, wearing ted hose while at work.  Has NOT taking blood pressure medicine-- Norvasc-- is out of rx.

## 2015-10-12 NOTE — Discharge Planning (Signed)
EDCM consulted to obtain PCP for self pay patient.  EDCM contacted [facility] to set up appointment.  EDCM was advised to call back Monday morning at 9:00 as the new schedule will be available for new patients.  EDCM relayed information to pt and Trey PaulaJeff, [place].

## 2015-10-16 ENCOUNTER — Telehealth: Payer: Self-pay | Admitting: *Deleted

## 2015-10-16 NOTE — Telephone Encounter (Signed)
Rhonda Best J. Lucretia RoersWood, RN, BSN, [place]CM [phone] [facility] with Dr. Julien NordmannLangeland on 10/2 @ 1030.  Spoke with pt at bedside and provided brochure with directions and phone number highlighted.  Pt verbalizes understanding of keeping appointment.

## 2015-10-16 NOTE — Telephone Encounter (Signed)
EDCM called to advise pt [facility]

## 2015-10-23 ENCOUNTER — Encounter: Payer: Self-pay | Admitting: Internal Medicine

## 2015-10-23 ENCOUNTER — Ambulatory Visit: Payer: Self-pay | Attending: Internal Medicine | Admitting: Internal Medicine

## 2015-10-23 VITALS — BP 158/99 | HR 69 | Temp 97.8°F | Resp 16 | Wt 180.4 lb

## 2015-10-23 DIAGNOSIS — Z1159 Encounter for screening for other viral diseases: Secondary | ICD-10-CM | POA: Insufficient documentation

## 2015-10-23 DIAGNOSIS — F17200 Nicotine dependence, unspecified, uncomplicated: Secondary | ICD-10-CM

## 2015-10-23 DIAGNOSIS — F17210 Nicotine dependence, cigarettes, uncomplicated: Secondary | ICD-10-CM | POA: Insufficient documentation

## 2015-10-23 DIAGNOSIS — Z114 Encounter for screening for human immunodeficiency virus [HIV]: Secondary | ICD-10-CM

## 2015-10-23 DIAGNOSIS — H6121 Impacted cerumen, right ear: Secondary | ICD-10-CM

## 2015-10-23 DIAGNOSIS — M25572 Pain in left ankle and joints of left foot: Secondary | ICD-10-CM | POA: Insufficient documentation

## 2015-10-23 DIAGNOSIS — Z0000 Encounter for general adult medical examination without abnormal findings: Secondary | ICD-10-CM | POA: Insufficient documentation

## 2015-10-23 DIAGNOSIS — I10 Essential (primary) hypertension: Secondary | ICD-10-CM | POA: Insufficient documentation

## 2015-10-23 LAB — CMP AND LIVER
ALT: 19 U/L (ref 6–29)
AST: 21 U/L (ref 10–35)
Albumin: 4.2 g/dL (ref 3.6–5.1)
Alkaline Phosphatase: 70 U/L (ref 33–130)
BILIRUBIN DIRECT: 0.1 mg/dL (ref <=0.2)
BILIRUBIN INDIRECT: 0.3 mg/dL (ref 0.2–1.2)
BUN: 15 mg/dL (ref 7–25)
CALCIUM: 9.3 mg/dL (ref 8.6–10.4)
CHLORIDE: 108 mmol/L (ref 98–110)
CO2: 24 mmol/L (ref 20–31)
CREATININE: 0.79 mg/dL (ref 0.50–1.05)
Glucose, Bld: 92 mg/dL (ref 65–99)
POTASSIUM: 3.7 mmol/L (ref 3.5–5.3)
Sodium: 141 mmol/L (ref 135–146)
Total Bilirubin: 0.4 mg/dL (ref 0.2–1.2)
Total Protein: 7.2 g/dL (ref 6.1–8.1)

## 2015-10-23 LAB — LIPID PANEL
CHOL/HDL RATIO: 3.5 ratio (ref <=5.0)
CHOLESTEROL: 201 mg/dL — AB (ref 125–200)
HDL: 58 mg/dL (ref >=46)
LDL CALC: 130 mg/dL — AB (ref <130)
TRIGLYCERIDES: 65 mg/dL (ref <150)
VLDL: 13 mg/dL (ref <30)

## 2015-10-23 MED ORDER — CARBAMIDE PEROXIDE 6.5 % OT SOLN: 5.0000 [drp] | Freq: Two times a day (BID) | OTIC | 0 refills | Status: DC

## 2015-10-23 MED ORDER — AMLODIPINE BESYLATE 10 MG PO TABS: 10.0000 mg | ORAL_TABLET | Freq: Every day | ORAL | 2 refills | Status: DC

## 2015-10-23 MED ORDER — NAPROXEN 500 MG PO TABS: 500.0000 mg | ORAL_TABLET | Freq: Two times a day (BID) | ORAL | 0 refills | Status: DC

## 2015-10-23 MED ORDER — GUAIFENESIN ER 600 MG PO TB12: 600.0000 mg | ORAL_TABLET | Freq: Two times a day (BID) | ORAL | 0 refills | Status: DC

## 2015-10-23 MED FILL — NAPROXEN 500 MG TABLET: 500 | 30 days supply | Qty: 60 | Fill #0

## 2015-10-23 MED FILL — EAR DROPS 6.5%: 6.5 | 7 days supply | Qty: 15 | Fill #0

## 2015-10-23 MED FILL — AMLODIPINE BESYLATE 10 MG T: 10 | 30 days supply | Qty: 30 | Fill #0

## 2015-10-23 NOTE — Progress Notes (Signed)
Pt is in the office today for ED follow up and hypertension Pt states her pain level is a 10 when she walks

## 2015-10-23 NOTE — Progress Notes (Signed)
Rhonda Best, is a 60 y.o. female  ZOX:[id number]  [id number]  DOB - Jun 12, 1955  CC:  Chief Complaint  Patient presents with  . Hypertension  . Follow-up       HPI: Rhonda Best is a 60 y.o. female here today to establish medical care. New to our clinic.  Recent ed visit 10/12/15 for left ankle pain, dx w/ overuse syndrome and given ankle brace.  She is using that, but still w/ considerable pain.  Gait is limited due to pain on left side. Pt wks at snf, so does a lot of walking throughout day.  Has been taking tylenol prn for pain. Not interested in ankle fluid aspiration.  +drinks pint of beer every 2 wks or so, smokes 1/3 ppd. No ivdu.  Patient has No headache, No chest pain, No abdominal pain - No Nausea, No new weakness tingling or numbness, No Cough - SOB.   Pt declined flu vac.  Review of Systems: Per HPI, o/w all systems reviewed and negative.    No Known Allergies Past Medical History:  Diagnosis Date  . Hypertension    Current Outpatient Prescriptions on File Prior to Visit  Medication Sig Dispense Refill  . acetaminophen (TYLENOL) 325 MG tablet Take 650 mg by mouth every 6 (six) hours as needed for moderate pain or headache.    [employer] Bandages & Supports (MEDICAL COMPRESSION STOCKINGS) MISC 1 Package by Does not apply route daily. (Patient not taking: Reported on 10/23/2015) 2 each 0  . traMADol (ULTRAM) 50 MG tablet Take 1 tablet (50 mg total) by mouth every 6 (six) hours as needed. (Patient not taking: Reported on 10/23/2015) 20 tablet 0   No current facility-administered medications on file prior to visit.    No family history on file. Social History   Social History  . Marital status: Single    Spouse name: N/A  . Number of children: N/A  . Years of education: N/A   Occupational History  . Not on file.   Social History Main Topics  . Smoking status: Current Every Day Smoker  . Smokeless tobacco: Never Used  . Alcohol use Yes   Comment: occasionally  . Drug use: No  . Sexual activity: Not on file   Other Topics Concern  . Not on file   Social History Narrative  . No narrative on file    Objective:   Vitals:   10/23/15 1045  BP: (!) 158/99  Pulse: 69  Resp: 16  Temp: 97.8 F (36.6 C)    Filed Weights   10/23/15 1045  Weight: 180 lb 6.4 oz (81.8 kg)    BP Readings from Last 3 Encounters:  10/23/15 (!) 158/99  10/12/15 (!) 211/100  06/06/15 177/84    Physical Exam: Constitutional: Patient appears well-developed and well-nourished. No distress. AAOx3, obese, pleasant HENT: Normocephalic, atraumatic, External right and left ear normal. Oropharynx is clear and moist. Right ceruminosis impaction. LEft TM clear. Eyes: Conjunctivae and EOM are normal. PERRL, no scleral icterus. Neck: Normal ROM. Neck supple. No JVD.  CVS: RRR, S1/S2 +, no murmurs, no gallops, no carotid bruit.  Pulmonary: Effort and breath sounds normal, no stridor, rhonchi, wheezes, rales.  Abdominal: Soft. BS +, no distension, tenderness, rebound or guarding.  Musculoskeletal: Normal range of motion. No edema and no tenderness.  LE: trace edema left ankle, mild ttp, no erythema/ no hot joint. able to bear weight on it. pulses 2+ bilateral. Neuro: Alert. muscle tone coordination wnl. No cranial  nerve deficit grossly. Skin: Skin is warm and dry. No rash noted. Not diaphoretic. No erythema. No pallor. Psychiatric: Normal mood and affect. Behavior, judgment, thought content normal.  Lab Results  Component Value Date   WBC 5.7 06/05/2015   HGB 13.0 06/05/2015   HCT 39.0 06/05/2015   MCV 95.1 06/05/2015   PLT 290 06/05/2015   Lab Results  Component Value Date   CREATININE 0.89 06/05/2015   BUN 14 06/05/2015   NA 140 06/05/2015   K 3.6 06/05/2015   CL 109 06/05/2015   CO2 22 06/05/2015    No results found for: HGBA1C Lipid Panel  No results found for: CHOL, TRIG, HDL, CHOLHDL, VLDL, LDLCALC     Depression screen PHQ  2/9 10/23/2015  Decreased Interest 0  Down, Depressed, Hopeless 0  PHQ - 2 Score 0    Assessment and plan:   1. HTN (hypertension), benign - on norvasc 10 in past, has not picked up new rx provided by ED yet - resent rx to our pharmacy, encouraged pt to pick up  2. 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 Health maintenance examination - Lipid Panel - CMP and Liver - CBC with Differential - needs pap smear - make fu appt - needs MM and colonoscopy, await financial aid packet - needs tdap ( we are out of this vac currently in clinic)   4. Encounter for screening for HIV - HIV antibody (with reflex)  5. Smoking tob cessation recd  6. Ceruminosis, right Debrox gtt to right ear bid  7. Need for hepatitis C screening test - Hepatitis C antibody  8. Financial aid packet  Return in about 4 weeks (around 11/20/2015) for htn/pap.  The patient was given clear instructions to go to ER or return to medical center if symptoms don't improve, worsen or new problems develop. The patient verbalized understanding. The patient was told to call to get lab results if they haven't heard anything in the next week.    This note has been created with Education officer, environmentalDragon speech recognition software and smart phrase technology. Any transcriptional errors are unintentional.   Pete Glatterawn T Chanequa Spees, MD, MBA/MHA [facility] [place], [place] [phone]   10/23/2015, 11:58 AM

## 2015-10-23 NOTE — Patient Instructions (Signed)
- fu 1 month for blood pressure and pap smear.  DASH Eating Plan DASH stands for "Dietary Approaches to Stop Hypertension." The DASH eating plan is a healthy eating plan that has been shown to reduce high blood pressure (hypertension). Additional health benefits may include reducing the risk of type 2 diabetes mellitus, heart disease, and stroke. The DASH eating plan may also help with weight loss. WHAT DO I NEED TO KNOW ABOUT THE DASH EATING PLAN? For the DASH eating plan, you will follow these general guidelines:  Choose foods with a percent daily value for sodium of less than 5% (as listed on the food label).  Use salt-free seasonings or herbs instead of table salt or sea salt.  Check with your health care provider or pharmacist before using salt substitutes.  Eat lower-sodium products, often labeled as "lower sodium" or "no salt added."  Eat fresh foods.  Eat more vegetables, fruits, and low-fat dairy products.  Choose whole grains. Look for the word "whole" as the first word in the ingredient list.  Choose fish and skinless chicken or [place] more often than red meat. Limit fish, poultry, and meat to 6 oz (170 g) each day.  Limit sweets, desserts, sugars, and sugary drinks.  Choose heart-healthy fats.  Limit cheese to 1 oz (28 g) per day.  Eat more home-cooked food and less restaurant, buffet, and fast food.  Limit fried foods.  Cook foods using methods other than frying.  Limit canned vegetables. If you do use them, rinse them well to decrease the sodium.  When eating at a restaurant, ask that your food be prepared with less salt, or no salt if possible. WHAT FOODS CAN I EAT? Seek help from a dietitian for individual calorie needs. Grains Whole grain or whole wheat bread. Brown rice. Whole grain or whole wheat pasta. Quinoa, bulgur, and whole grain cereals. Low-sodium cereals. Corn or whole wheat flour tortillas. Whole grain cornbread. Whole grain crackers. Low-sodium  crackers. Vegetables Fresh or frozen vegetables (raw, steamed, roasted, or grilled). Low-sodium or reduced-sodium tomato and vegetable juices. Low-sodium or reduced-sodium tomato sauce and paste. Low-sodium or reduced-sodium canned vegetables.  Fruits All fresh, canned (in natural juice), or frozen fruits. Meat and Other Protein Products Ground beef (85% or leaner), grass-fed beef, or beef trimmed of fat. Skinless chicken or [place]. Ground chicken or [place]. Pork trimmed of fat. All fish and seafood. Eggs. Dried beans, peas, or lentils. Unsalted nuts and seeds. Unsalted canned beans. Dairy Low-fat dairy products, such as skim or 1% milk, 2% or reduced-fat cheeses, low-fat ricotta or cottage cheese, or plain low-fat yogurt. Low-sodium or reduced-sodium cheeses. Fats and Oils Tub margarines without trans fats. Light or reduced-fat mayonnaise and salad dressings (reduced sodium). Avocado. Safflower, olive, or canola oils. Natural peanut or almond butter. Other Unsalted popcorn and pretzels. The items listed above may not be a complete list of recommended foods or beverages. Contact your dietitian for more options. WHAT FOODS ARE NOT RECOMMENDED? Grains White bread. White pasta. White rice. Refined cornbread. Bagels and croissants. Crackers that contain trans fat. Vegetables Creamed or fried vegetables. Vegetables in a cheese sauce. Regular canned vegetables. Regular canned tomato sauce and paste. Regular tomato and vegetable juices. Fruits Dried fruits. Canned fruit in light or heavy syrup. Fruit juice. Meat and Other Protein Products Fatty cuts of meat. Ribs, chicken wings, bacon, sausage, bologna, salami, chitterlings, fatback, hot dogs, bratwurst, and packaged luncheon meats. Salted nuts and seeds. Canned beans with salt. Dairy Whole or 2%  milk, cream, half-and-half, and cream cheese. Whole-fat or sweetened yogurt. Full-fat cheeses or blue cheese. Nondairy creamers and whipped toppings.  Processed cheese, cheese spreads, or cheese curds. Condiments Onion and garlic salt, seasoned salt, table salt, and sea salt. Canned and packaged gravies. Worcestershire sauce. Tartar sauce. Barbecue sauce. Teriyaki sauce. Soy sauce, including reduced sodium. Steak sauce. Fish sauce. Oyster sauce. Cocktail sauce. Horseradish. Ketchup and mustard. Meat flavorings and tenderizers. Bouillon cubes. Hot sauce. Tabasco sauce. Marinades. Taco seasonings. Relishes. Fats and Oils Butter, stick margarine, lard, shortening, ghee, and bacon fat. Coconut, palm kernel, or palm oils. Regular salad dressings. Other Pickles and olives. Salted popcorn and pretzels. The items listed above may not be a complete list of foods and beverages to avoid. Contact your dietitian for more information. WHERE CAN I FIND MORE INFORMATION? National Heart, Lung, and Blood Institute: [employer]   This information is not intended to replace advice given to you by your health care provider. Make sure you discuss any questions you have with your health care provider.   Document Released: 12/27/2010 Document Revised: 01/28/2014 Document Reviewed: 11/11/2012 Elsevier Interactive Patient Education 2016 [employer] Inc[employer]   -  Hypertension Hypertension is another name for high blood pressure. High blood pressure forces your heart to work harder to pump blood. A blood pressure reading has two numbers, which includes a higher number over a lower number (example: 110/72). HOME CARE   Have your blood pressure rechecked by your doctor.  Only take medicine as told by your doctor. Follow the directions carefully. The medicine does not work as well if you skip doses. Skipping doses also puts you at risk for problems.  Do not smoke.  Monitor your blood pressure at home as told by your doctor. GET HELP IF:  You think you are having a reaction to the medicine you are taking.  You have repeat  headaches or feel dizzy.  You have puffiness (swelling) in your ankles.  You have trouble with your vision. GET HELP RIGHT AWAY IF:   You get a very bad headache and are confused.  You feel weak, numb, or faint.  You get chest or belly (abdominal) pain.  You throw up (vomit).  You cannot breathe very well. MAKE SURE YOU:   Understand these instructions.  Will watch your condition.  Will get help right away if you are not doing well or get worse.   This information is not intended to replace advice given to you by your health care provider. Make sure you discuss any questions you have with your health care provider.   Document Released: 06/26/2007 Document Revised: 01/12/2013 Document Reviewed: 10/30/2012 Elsevier Interactive Patient Education 2016 [employer].  -  You Can Quit Smoking If you are ready to quit smoking or are thinking about it, congratulations! You have chosen to help yourself be healthier and live longer! There are lots of different ways to quit smoking. Nicotine gum, nicotine patches, a nicotine inhaler, or nicotine nasal spray can help with physical craving. Hypnosis, support groups, and medicines help break the habit of smoking. TIPS TO GET OFF AND STAY OFF CIGARETTES  Learn to predict your moods. Do not let a bad situation be your excuse to have a cigarette. Some situations in your life might tempt you to have a cigarette.  Ask friends and co-workers not to smoke around you.  Make your home smoke-free.  Never have "just one" cigarette. It leads to wanting another and another. Remind yourself of your decision to  quit.  On a card, make a list of your reasons for not smoking. Read it at least the same number of times a day as you have a cigarette. Tell yourself everyday, "I do not want to smoke. I choose not to smoke."  Ask someone at home or work to help you with your plan to quit smoking.  Have something planned after you eat or have a cup of  coffee. Take a walk or get other exercise to perk you up. This will help to keep you from overeating.  Try a relaxation exercise to calm you down and decrease your stress. Remember, you may be tense and nervous the first two weeks after you quit. This will pass.  Find new activities to keep your hands busy. Play with a pen, coin, or rubber band. Doodle or draw things on paper.  Brush your teeth right after eating. This will help cut down the craving for the taste of tobacco after meals. You can try mouthwash too.  Try gum, breath mints, or diet candy to keep something in your mouth. IF YOU SMOKE AND WANT TO QUIT:  Do not stock up on cigarettes. Never buy a carton. Wait until one pack is finished before you buy another.  Never carry cigarettes with you at work or at home.  Keep cigarettes as far away from you as possible. Leave them with someone else.  Never carry matches or a lighter with you.  Ask yourself, "Do I need this cigarette or is this just a reflex?"  Bet with someone that you can quit. Put cigarette money in a piggy bank every morning. If you smoke, you give up the money. If you do not smoke, by the end of the week, you keep the money.  Keep trying. It takes 21 days to change a habit!  Talk to your doctor about using medicines to help you quit. These include nicotine replacement gum, lozenges, or skin patches.   This information is not intended to replace advice given to you by your health care provider. Make sure you discuss any questions you have with your health care provider.   Document Released: 11/03/2008 Document Revised: 04/01/2011 Document Reviewed: 11/03/2008 Elsevier Interactive Patient Education 2016 Elsevier Inc.  -  RICE for Routine Care of Injuries Theroutine careofmanyinjuriesincludes rest, ice, compression, and elevation (RICE therapy). RICE therapy is often recommended for injuries to soft tissues, such as a muscle strain, ligament injuries,  bruises, and overuse injuries. It can also be used for some bony injuries. Using RICE therapy can help to relieve pain, lessen swelling, and enable your body to heal. Rest Rest is required to allow your body to heal. This usually involves reducing your normal activities and avoiding use of the injured part of your body. Generally, you can return to your normal activities when you are comfortable and have been given permission by your health care provider. Ice Icing your injury helps to keep the swelling down, and it lessens pain. Do not apply ice directly to your skin.  Put ice in a plastic bag.  Place a towel between your skin and the bag.  Leave the ice on for 20 minutes, 2-3 times a day. Do this for as long as you are directed by your health care provider. Compression Compression means putting pressure on the injured area. Compression helps to keep swelling down, gives support, and helps with discomfort. Compression may be done with an elastic bandage. If an elastic bandage has been applied, follow  these general tips:  Remove and reapply the bandage every 3-4 hours or as directed by your health care provider.  Make sure the bandage is not wrapped too tightly, because this can cut off circulation. If part of your body beyond the bandage becomes blue, numb, cold, swollen, or more painful, your bandage is most likely too tight. If this occurs, remove your bandage and reapply it more loosely.  See your health care provider if the bandage seems to be making your problems worse rather than better. Elevation Elevation means keeping the injured area raised. This helps to lessen swelling and decrease pain. If possible, your injured area should be elevated at or above the level of your heart or the center of your chest. WHEN SHOULD I SEEK MEDICAL CARE? You should seek medical care if:  Your pain and swelling continue.  Your symptoms are getting worse rather than improving. These symptoms may  indicate that further evaluation or further X-rays are needed. Sometimes, X-rays may not show a small broken bone (fracture) until a number of days later. Make a follow-up appointment with your health care provider. WHEN SHOULD I SEEK IMMEDIATE MEDICAL CARE? You should seek immediate medical care if:  You have sudden severe pain at or below the area of your injury.  You have redness or increased swelling around your injury.  You have tingling or numbness at or below the area of your injury that does not improve after you remove the elastic bandage.   This information is not intended to replace advice given to you by your health care provider. Make sure you discuss any questions you have with your health care provider.   Document Released: 04/21/2000 Document Revised: 09/28/2014 Document Reviewed: 12/15/2013 Elsevier Interactive Patient Education [employer]

## 2015-10-24 LAB — CBC WITH DIFFERENTIAL/PLATELET

## 2015-10-24 LAB — HIV ANTIBODY (ROUTINE TESTING W REFLEX): HIV 1&2 Ab, 4th Generation: NONREACTIVE

## 2015-10-25 ENCOUNTER — Other Ambulatory Visit: Payer: Self-pay

## 2015-10-27 ENCOUNTER — Telehealth: Payer: Self-pay

## 2015-10-27 NOTE — Telephone Encounter (Signed)
Contacted pt to go over lab results pt didn't answer lvm asking pt to give me a call back at her earliest convenience  

## 2015-10-30 ENCOUNTER — Telehealth: Payer: Self-pay

## 2015-10-30 NOTE — Telephone Encounter (Signed)
Contacted pt to go over lab results pt didn't answer lvm asking pt to give me a call back at her earliest convenience and I will be sending results out today

## 2016-03-10 ENCOUNTER — Emergency Department (HOSPITAL_COMMUNITY): Payer: Self-pay

## 2016-03-10 ENCOUNTER — Encounter (HOSPITAL_COMMUNITY): Payer: Self-pay | Admitting: Emergency Medicine

## 2016-03-10 DIAGNOSIS — F17200 Nicotine dependence, unspecified, uncomplicated: Secondary | ICD-10-CM | POA: Insufficient documentation

## 2016-03-10 DIAGNOSIS — M940 Chondrocostal junction syndrome [Tietze]: Secondary | ICD-10-CM | POA: Insufficient documentation

## 2016-03-10 DIAGNOSIS — I10 Essential (primary) hypertension: Secondary | ICD-10-CM | POA: Insufficient documentation

## 2016-03-10 DIAGNOSIS — B349 Viral infection, unspecified: Secondary | ICD-10-CM | POA: Insufficient documentation

## 2016-03-10 LAB — I-STAT TROPONIN, ED: TROPONIN I, POC: 0 ng/mL (ref 0.00–0.08)

## 2016-03-10 LAB — BASIC METABOLIC PANEL
Anion gap: 12 (ref 5–15)
BUN: 12 mg/dL (ref 6–20)
CALCIUM: 9.5 mg/dL (ref 8.9–10.3)
CO2: 23 mmol/L (ref 22–32)
CREATININE: 0.95 mg/dL (ref 0.44–1.00)
Chloride: 104 mmol/L (ref 101–111)
GFR calc Af Amer: >60 mL/min (ref >60)
Glucose, Bld: 120 mg/dL — ABNORMAL HIGH (ref 65–99)
POTASSIUM: 3.5 mmol/L (ref 3.5–5.1)
SODIUM: 139 mmol/L (ref 135–145)

## 2016-03-10 LAB — CBC
HCT: 38.3 % (ref 36.0–46.0)
Hemoglobin: 12.6 g/dL (ref 12.0–15.0)
MCH: 31.2 pg (ref 26.0–34.0)
MCHC: 32.9 g/dL (ref 30.0–36.0)
MCV: 94.8 fL (ref 78.0–100.0)
PLATELETS: 319 10*3/uL (ref 150–400)
RBC: 4.04 MIL/uL (ref 3.87–5.11)
RDW: 14 % (ref 11.5–15.5)
WBC: 7.5 10*3/uL (ref 4.0–10.5)

## 2016-03-10 NOTE — ED Triage Notes (Signed)
C/o intermittent tightness to L chest since yesterday with nausea and sob.  Denies pain at present.  Also reports decreased appetite, no taste, and no smell x 1 week.

## 2016-03-11 ENCOUNTER — Emergency Department (HOSPITAL_COMMUNITY)
Admission: EM | Admit: 2016-03-11 | Discharge: 2016-03-11 | Disposition: A | Discharge status: Home / Self Care | Payer: Self-pay | Attending: Emergency Medicine | Admitting: Emergency Medicine

## 2016-03-11 DIAGNOSIS — B349 Viral infection, unspecified: Secondary | ICD-10-CM

## 2016-03-11 DIAGNOSIS — M940 Chondrocostal junction syndrome [Tietze]: Secondary | ICD-10-CM

## 2016-03-11 DIAGNOSIS — I10 Essential (primary) hypertension: Secondary | ICD-10-CM

## 2016-03-11 MED ORDER — AMLODIPINE BESYLATE 5 MG PO TABS
10.0000 mg | ORAL_TABLET | Freq: Every day | ORAL | Status: DC
  Administered 2016-03-11: 10 mg via ORAL
  Filled 2016-03-11: qty 2

## 2016-03-11 MED ORDER — ACETAMINOPHEN 325 MG PO TABS: 650.0000 mg | ORAL_TABLET | Freq: Four times a day (QID) | ORAL | 0 refills | Status: DC | PRN

## 2016-03-11 MED ORDER — AMLODIPINE BESYLATE 10 MG PO TABS: 10.0000 mg | ORAL_TABLET | Freq: Every day | ORAL | 0 refills | Status: DC

## 2016-03-11 NOTE — ED Provider Notes (Signed)
[facility]-EMERGENCY DEPT Provider Note   CSN: [id number] Arrival date & time: 03/10/16  2311    History   Chief Complaint Chief Complaint  Patient presents with  . Chest Pain    HPI Rhonda Best is a 61 y.o. female.  61 year old female with a history of hypertension presents to the emergency department for evaluation of left-sided chest tightness which began yesterday. Symptoms are intermittent. No known modifying factors of symptoms area patient states that she felt as though she could not take a deep breath initially. She has no complaints of shortness of breath at present. She states that symptoms are aggravated with coughing. Her symptoms were preceded by anorexia and fatigue as well as loss of smell and taste. She further reports intermittent nausea. She has not taken anything in the past 48 hours for symptoms. No recent syncope, leg swelling, or known fevers. Patient denies personal or family history of heart attack. She has not taken her blood pressure medication today.   The history is provided by the patient. No language interpreter was used.  Chest Pain      Past Medical History:  Diagnosis Date  . Hypertension     There are no active problems to display for this patient.   History reviewed. No pertinent surgical history.  OB History    No data available       Home Medications    Prior to Admission medications   Medication Sig Start Date End Date Taking? Authorizing Provider  acetaminophen (TYLENOL) 325 MG tablet Take 2 tablets (650 mg total) by mouth every 6 (six) hours as needed for mild pain, moderate pain or headache. 03/11/16   Antony MaduraKelly Xolani Degracia, PA-C  amLODipine (NORVASC) 10 MG tablet Take 1 tablet (10 mg total) by mouth daily. 03/11/16   Antony MaduraKelly Felice Deem, PA-C    Family History No family history on file.  Social History Social History  Substance Use Topics  . Smoking status: Current Every Day Smoker  . Smokeless tobacco: Never Used  . Alcohol use Yes   Comment: occasionally     Allergies   Patient has no known allergies.   Review of Systems Review of Systems  Cardiovascular: Positive for chest pain.  Ten systems reviewed and are negative for acute change, except as noted in the HPI.    Physical Exam Updated Vital Signs BP (!) 192/106 (BP Location: Right Arm)   Pulse 60   Temp 98.1 F (36.7 C) (Oral)   Resp 17   Ht 5\' 3"  (1.6 m)   Wt 80.7 kg   SpO2 100%   BMI 31.53 kg/m   Physical Exam  Constitutional: She is oriented to person, place, and time. She appears well-developed and well-nourished. No distress.  Nontoxic and in NAD  HENT:  Head: Normocephalic and atraumatic.  Eyes: Conjunctivae and EOM are normal. No scleral icterus.  Neck: Normal range of motion.  No JVD  Cardiovascular: Normal rate, regular rhythm and intact distal pulses.   Pulmonary/Chest: Effort normal. No respiratory distress. She has no wheezes. She has no rales.  Respirations even and unlabored  Musculoskeletal: Normal range of motion.  No pitting edema in BLE  Neurological: She is alert and oriented to person, place, and time. She exhibits normal muscle tone. Coordination normal.  GCS 15. Patient moving all extremities.  Skin: Skin is warm and dry. No rash noted. She is not diaphoretic. No erythema. No pallor.  Psychiatric: She has a normal mood and affect. Her behavior is normal.  Nursing note and vitals reviewed.    ED Treatments / Results  Labs (all labs ordered are listed, but only abnormal results are displayed) Labs Reviewed  BASIC METABOLIC PANEL - Abnormal; Notable for the following:       Result Value   Glucose, Bld 120 (*)    All other components within normal limits  CBC  I-STAT TROPOININ, ED    EKG  EKG Interpretation  Date/Time:  Sunday March 10 2016 23:22:49 EST Ventricular Rate:  68 PR Interval:  154 QRS Duration: 64 QT Interval:  376 QTC Calculation: 399 R Axis:   47 Text Interpretation:  Normal sinus rhythm  Normal ECG No old tracing to compare Confirmed by Oni, Adeleke Ayokunle ([id number]) on 03/11/2016 1:08:00 AM       Radiology Dg Chest 2 View  Result Date: 03/11/2016 CLINICAL DATA:  Initial evaluation for acute pain and cough for 2 days. EXAM: CHEST  2 VIEW COMPARISON:  Prior radiograph from 10/12/2007. FINDINGS: The heart size and mediastinal contours are within normal limits. Both lungs are clear. The visualized skeletal structures are unremarkable. IMPRESSION: No active cardiopulmonary disease. Electronically Signed   By: Benjamin  McClintock M.D.   On: 03/11/2016 00:02    Procedures Procedures (including critical care time)  Medications Ordered in ED Medications  amLODipine (NORVASC) tablet 10 mg (not administered)     Initial Impression / Assessment and Plan / ED Course  I have reviewed the triage vital signs and the nursing notes.  Pertinent labs & imaging results that were available during my care of the patient were reviewed by me and considered in my medical decision making (see chart for details).     60  year old female presents to the emergency department for evaluation of chest pain. Chest pain has been intermittent since yesterday, lasting a few seconds before spontaneously resolving. Patient describes the pain as a tightness. Chest pain preceded by symptoms likely secondary to viral illness. Suspect pain to be costochondral in etiology.   The patient has a reassuring cardiac workup today. Heart score is 2 c/w low risk of ACS and symptoms atypical of cardiac-related chest pain. CXR without evidence of other cardiopulmonary abnormality. HTN noted, but patient reports missing her dose of antihypertensives today. Will refer to PCP for follow up and BP recheck. Supportive therapy indicated with return if symptoms worsen. Patient discharged in stable condition with no unaddressed concerns.   Final Clinical Impressions(s) / ED Diagnoses   Final diagnoses:  Hypertension not at  goal  Costochondritis, acute  Viral illness    New Prescriptions Current Discharge Medication List       Antony Madura, PA-C 03/11/16 0135    Tomasita Crumble, MD 03/11/16 [phone]

## 2016-03-11 NOTE — Discharge Instructions (Signed)
We believe that your chest pain is due to muscle spasms in the wall of your chest from coughing. You work up today does not suggest concerning process with your heart or lungs. Have your blood pressure rechecked by your primary care doctor. Take your Norvasc daily. Take Tylenol for pain. You may return for new or concerning symptoms.

## 2016-09-30 ENCOUNTER — Encounter (HOSPITAL_COMMUNITY): Payer: Self-pay | Admitting: Emergency Medicine

## 2016-09-30 ENCOUNTER — Emergency Department (HOSPITAL_COMMUNITY)
Admission: EM | Admit: 2016-09-30 | Discharge: 2016-09-30 | Disposition: A | Discharge status: Home / Self Care | Payer: Self-pay | Attending: Emergency Medicine | Admitting: Emergency Medicine

## 2016-09-30 DIAGNOSIS — Z79899 Other long term (current) drug therapy: Secondary | ICD-10-CM | POA: Insufficient documentation

## 2016-09-30 DIAGNOSIS — K047 Periapical abscess without sinus: Secondary | ICD-10-CM | POA: Insufficient documentation

## 2016-09-30 DIAGNOSIS — F17210 Nicotine dependence, cigarettes, uncomplicated: Secondary | ICD-10-CM | POA: Insufficient documentation

## 2016-09-30 DIAGNOSIS — K0889 Other specified disorders of teeth and supporting structures: Secondary | ICD-10-CM

## 2016-09-30 DIAGNOSIS — I10 Essential (primary) hypertension: Secondary | ICD-10-CM | POA: Insufficient documentation

## 2016-09-30 MED ORDER — HYDROCODONE-ACETAMINOPHEN 5-325 MG PO TABS
2.0000 | ORAL_TABLET | Freq: Once | ORAL | Status: AC
  Administered 2016-09-30: 2 via ORAL
  Filled 2016-09-30: qty 2

## 2016-09-30 MED ORDER — CHLORHEXIDINE GLUCONATE 0.12 % MT SOLN: 15.0000 mL | Freq: Two times a day (BID) | OROMUCOSAL | 0 refills | Status: DC

## 2016-09-30 MED ORDER — CLINDAMYCIN HCL 150 MG PO CAPS: 450.0000 mg | ORAL_CAPSULE | Freq: Three times a day (TID) | ORAL | 0 refills | Status: AC

## 2016-09-30 MED ORDER — BUPIVACAINE HCL (PF) 0.5 % IJ SOLN
15.0000 mL | Freq: Once | INTRAMUSCULAR | Status: AC
  Administered 2016-09-30: 15 mL
  Filled 2016-09-30: qty 20

## 2016-09-30 MED ORDER — HYDROCODONE-ACETAMINOPHEN 5-325 MG PO TABS: 1.0000 | ORAL_TABLET | Freq: Four times a day (QID) | ORAL | 0 refills | Status: DC | PRN

## 2016-09-30 MED ORDER — BUPIVACAINE HCL 0.5 % IJ SOLN
15.0000 mL | Freq: Once | INTRAMUSCULAR | Status: DC
  Filled 2016-09-30: qty 15

## 2016-09-30 NOTE — ED Provider Notes (Signed)
[facility] Provider Note   CSN: [id number] Arrival date & time: 09/30/16  0848     History   Chief Complaint Chief Complaint  Patient presents with  . Dental Pain    HPI Rhonda Best is a 61 y.o. female who presents with 1 week of progressively worsening right jaw pain and swelling. Patient states that she has been taking ibuprofen with no improvement in pain. Patient states that she also had some leftover penicillin from a previous issue that she had been taking. Patient reports the pain is worsened with chewing but states that she is still able tolerate by mouth. She denies any redness/swelling to the face. She denies any fever, chills.  The history is provided by the patient.    Past Medical History:  Diagnosis Date  . Hypertension     There are no active problems to display for this patient.   History reviewed. No pertinent surgical history.  OB History    No data available       Home Medications    Prior to Admission medications   Medication Sig Start Date End Date Taking? Authorizing Provider  acetaminophen (TYLENOL) 325 MG tablet Take 2 tablets (650 mg total) by mouth every 6 (six) hours as needed for mild pain, moderate pain or headache. 03/11/16   Antony MaduraHumes, Kelly, PA-C  amLODipine (NORVASC) 10 MG tablet Take 1 tablet (10 mg total) by mouth daily. 03/11/16   Antony MaduraHumes, Kelly, PA-C  chlorhexidine (PERIDEX) 0.12 % solution Use as directed 15 mLs in the mouth or throat 2 (two) times daily. 09/30/16   Maxwell CaulLayden, Lindsey A, PA-C  clindamycin (CLEOCIN) 150 MG capsule Take 3 capsules (450 mg total) by mouth 3 (three) times daily. 09/30/16 10/07/16  Maxwell CaulLayden, Lindsey A, PA-C  HYDROcodone-acetaminophen (NORCO/VICODIN) 5-325 MG tablet Take 1-2 tablets by mouth every 6 (six) hours as needed. 09/30/16   Maxwell CaulLayden, Lindsey A, PA-C    Family History History reviewed. No pertinent family history.  Social History Social History  Substance Use Topics  . Smoking status: Current  Every Day Smoker    Packs/day: 0.05    Types: Cigarettes  . Smokeless tobacco: Never Used  . Alcohol use Yes     Comment: occasionally     Allergies   Patient has no known allergies.   Review of Systems Review of Systems  Constitutional: Negative for fever.  HENT: Positive for dental problem and facial swelling. Negative for trouble swallowing.   Gastrointestinal: Negative for nausea and vomiting.     Physical Exam Updated Vital Signs BP (!) 161/97 (BP Location: Left Arm)   Pulse 89   Temp 98.2 F (36.8 C) (Oral)   Resp 16   Ht 5\' 3"  (1.6 m)   Wt 77.1 kg (170 lb)   SpO2 97%   BMI 30.11 kg/m   Physical Exam  Constitutional: She appears well-developed and well-nourished.  Appears uncomfortable but no acute distress   HENT:  Head: Normocephalic and atraumatic.  Mouth/Throat: Uvula is midline and oropharynx is clear and moist. No trismus in the jaw. Abnormal dentition.  Gingival erythema and swelling noted to the right lower gum with evidence of fluctuance. Concern for dental abscess in the right lower region. Approximately 3 cm in diameter area of fluctuance and swelling to the right lower cheek. Multiple scattered dental caries.  Eyes: Conjunctivae and EOM are normal. Right eye exhibits no discharge. Left eye exhibits no discharge. No scleral icterus.  Pulmonary/Chest: Effort normal and breath sounds normal.  No evidence of respiratory distress. Able to speak in full sentences without difficulty.  Neurological: She is alert.  Skin: Skin is warm and dry.  Psychiatric: She has a normal mood and affect. Her speech is normal and behavior is normal.  Nursing note and vitals reviewed.    ED Treatments / Results  Labs (all labs ordered are listed, but only abnormal results are displayed) Labs Reviewed - No data to display  EKG  EKG Interpretation None       Radiology No results found.  Procedures .Marland KitchenIncision and Drainage Date/Time: 09/30/2016 10:45  AM Performed by: Graciella Freer A Authorized by: Graciella Freer A   Consent:    Consent obtained:  Verbal   Consent given by:  Patient   Risks discussed:  Bleeding, incomplete drainage and infection   Alternatives discussed:  No treatment Location:    Type:  Abscess   Location:  Mouth Anesthesia (see MAR for exact dosages):    Anesthesia method:  Local infiltration and nerve block   Local anesthetic:  Bupivacaine 0.25% w/o epi   Block needle gauge:  27 G   Block anesthetic:  Bupivacaine 0.25% w/o epi   Block injection procedure:  Anatomic landmarks identified, introduced needle, incremental injection and negative aspiration for blood   Block outcome:  Anesthesia achieved Procedure type:    Complexity:  Simple Procedure details:    Needle aspiration: yes     Needle size:  22 G   Incision types:  Stab incision   Scalpel blade:  11   Wound management:  Irrigated with saline   Drainage:  Purulent   Drainage amount:  Moderate   Wound treatment:  Wound left open Post-procedure details:    Patient tolerance of procedure:  Tolerated well, no immediate complications Comments:     A dental block was performed to achieve anesthesia. Once anesthesia was achieved, local filtration was used to numb the remaining area. A needle aspiration showed purulent material. A small incision was made with moderate amount of purulent material expressed. Patient tolerated procedure well without any medications.   (including critical care time)  Medications Ordered in ED Medications  bupivacaine (MARCAINE) 0.5 % injection 15 mL (15 mLs Infiltration Given 09/30/16 1107)  HYDROcodone-acetaminophen (NORCO/VICODIN) 5-325 MG per tablet 2 tablet (2 tablets Oral Given 09/30/16 1204)     Initial Impression / Assessment and Plan / ED Course  I have reviewed the triage vital signs and the nursing notes.  Pertinent labs & imaging results that were available during my care of the patient were reviewed by me  and considered in my medical decision making (see chart for details).     61 year old female who presents with 1 week of progressively worsening dental/jaw pain. No fever, chills. Patient is afebrile, non-toxic appearing, sitting comfortably on examination table. Vital signs reviewed and stable. Patient is slightly hypertensive, likely secondary to pain. Will reassess. Physical exam is consistent with dental abscess. History/physical exam or not concerning for Ludwig angina or peritonsillar abscess. Given extent and signs of abscess, we'll plan to I&D in the department. Discussed with patient. She is agreeable to plan.  I&D as documented above. Moderate purulent drainage expressed. Will plan to provide antibiotic therapy for coverage. We'll also give a short course of pain medication for symptomatic relief. Patient instructed to follow-up with dentist referral provided. Stable for discharge at this time. Strict return precautions discussed. Patient expresses understanding and agreement to plan.   Final Clinical Impressions(s) / ED Diagnoses  Final diagnoses:  Pain, dental  Dental abscess    New Prescriptions Discharge Medication List as of 09/30/2016 12:45 PM    START taking these medications   Details  chlorhexidine (PERIDEX) 0.12 % solution Use as directed 15 mLs in the mouth or throat 2 (two) times daily., Starting Mon 09/30/2016, Print    clindamycin (CLEOCIN) 150 MG capsule Take 3 capsules (450 mg total) by mouth 3 (three) times daily., Starting Mon 09/30/2016, Until Mon 10/07/2016, Print    HYDROcodone-acetaminophen (NORCO/VICODIN) 5-325 MG tablet Take 1-2 tablets by mouth every 6 (six) hours as needed., Starting Mon 09/30/2016, Print         Maxwell Caul, PA-C 10/05/16 0127    Little, Ambrose Finland, MD 10/05/16 1024

## 2016-09-30 NOTE — Discharge Instructions (Signed)
Take antibiotics as directed. Please take all of your antibiotics until finished.   You can take Tylenol or Ibuprofen as directed for pain. You can take the pain medication as needed for pain.   You can apply warm compresses to the area.   The exam and treatment you received today has been provided on an emergency basis only. This is not a substitute for complete medical or dental care. If your problem worsens or new symptoms (problems) appear, and you are unable to arrange prompt follow-up care with your dentist, call or return to this location. If you do not have a dentist, please follow-up with one on the list provided  CALL YOUR DENTIST OR RETURN IMMEDIATELY IF you develop a fever, rash, difficulty breathing or swallowing, neck or facial swelling, or other potentially serious concerns.

## 2016-09-30 NOTE — ED Notes (Signed)
Declined W/C at D/C and was escorted to lobby by RN. 

## 2016-09-30 NOTE — ED Triage Notes (Signed)
Pt arrives via POv from home with right lower jaw swelling and pain for the last week. States has tried ibuprofen and left over PCN to treat symptoms. Pt reports little improvement.

## 2017-05-30 ENCOUNTER — Emergency Department (HOSPITAL_COMMUNITY)
Admission: EM | Admit: 2017-05-30 | Discharge: 2017-05-30 | Disposition: A | Discharge status: Home / Self Care | Payer: Self-pay

## 2017-05-30 NOTE — ED Notes (Signed)
Pt leaving d/t wait.

## 2017-05-30 NOTE — ED Notes (Signed)
No answer in lobby.

## 2017-05-30 NOTE — ED Notes (Signed)
No answer in waiting room 

## 2017-05-31 ENCOUNTER — Encounter (HOSPITAL_COMMUNITY): Payer: Self-pay | Admitting: *Deleted

## 2017-05-31 ENCOUNTER — Ambulatory Visit (HOSPITAL_COMMUNITY)
Admission: EM | Admit: 2017-05-31 | Discharge: 2017-05-31 | Disposition: A | Discharge status: Home / Self Care | Payer: Self-pay | Attending: Family Medicine | Admitting: Family Medicine

## 2017-05-31 DIAGNOSIS — I10 Essential (primary) hypertension: Secondary | ICD-10-CM

## 2017-05-31 DIAGNOSIS — F419 Anxiety disorder, unspecified: Secondary | ICD-10-CM

## 2017-05-31 MED ORDER — FLUOXETINE HCL 20 MG PO TABS: 20.0000 mg | ORAL_TABLET | Freq: Every day | ORAL | 3 refills | Status: DC

## 2017-05-31 NOTE — Discharge Instructions (Signed)
We will call you to help you find a primary care doctor next week

## 2017-05-31 NOTE — ED Triage Notes (Signed)
C/O anxiety and "my mind is off", uncontrollable crying over past 1.5 wks intermittently.  Denies any suicidal/homicidal ideation.
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
## 2017-08-10 ENCOUNTER — Other Ambulatory Visit: Payer: Self-pay

## 2017-08-10 ENCOUNTER — Ambulatory Visit (HOSPITAL_COMMUNITY)
Admission: EM | Admit: 2017-08-10 | Discharge: 2017-08-10 | Disposition: A | Discharge status: Home / Self Care | Payer: Self-pay | Attending: Family Medicine | Admitting: Family Medicine

## 2017-08-10 ENCOUNTER — Encounter (HOSPITAL_COMMUNITY): Payer: Self-pay | Admitting: Emergency Medicine

## 2017-08-10 ENCOUNTER — Ambulatory Visit (INDEPENDENT_AMBULATORY_CARE_PROVIDER_SITE_OTHER): Payer: Self-pay

## 2017-08-10 DIAGNOSIS — S92514A Nondisplaced fracture of proximal phalanx of right lesser toe(s), initial encounter for closed fracture: Secondary | ICD-10-CM

## 2017-08-10 DIAGNOSIS — S92511A Displaced fracture of proximal phalanx of right lesser toe(s), initial encounter for closed fracture: Secondary | ICD-10-CM

## 2017-08-10 MED ORDER — IBUPROFEN 800 MG PO TABS: 800.0000 mg | ORAL_TABLET | Freq: Three times a day (TID) | ORAL | 0 refills | Status: DC | PRN

## 2017-08-10 NOTE — ED Triage Notes (Signed)
[facility] with a complaint of pain to the 5th toe on her right foot secondary to hitting it against a chair leg 2 days ago.

## 2017-08-10 NOTE — ED Provider Notes (Signed)
[facility]    CSN: [id number] Arrival date & time: 08/10/17  1008     History   Chief Complaint Chief Complaint  Patient presents with  . Foot Pain    HPI Rhonda Best is a 62 y.o. female.   HPI  Patient accidentally kicked a piece of furniture 2 days ago and hit the fifth toe on her right foot.  The toe "bent sideways".  Has been severely painful ever since then.  Pain with weightbearing.  Comfortably wear shoe.  No open wound or bleeding.  Otherwise states she is in good health and compliant with her blood pressure medicines  Past Medical History:  Diagnosis Date  . Hypertension     There are no active problems to display for this patient.   History reviewed. No pertinent surgical history.  OB History   None      Home Medications    Prior to Admission medications   Medication Sig Start Date End Date Taking? Authorizing Provider  amLODipine (NORVASC) 10 MG tablet Take 1 tablet (10 mg total) by mouth daily. 03/11/16  Yes Antony MaduraHumes, Kelly, PA-C  ibuprofen (ADVIL,MOTRIN) 800 MG tablet Take 1 tablet (800 mg total) by mouth every 8 (eight) hours as needed for moderate pain. 08/10/17   Eustace MooreNelson, Frank Pilger Sue, MD    Family History Family History  Problem Relation Age of Onset  . Healthy Mother   Denies hypertension, heart disease, cancer Social History Social History   Tobacco Use  . Smoking status: Current Every Day Smoker    Packs/day: 0.05    Types: Cigarettes  . Smokeless tobacco: Never Used  Substance Use Topics  . Alcohol use: Yes    Comment: occasionally  . Drug use: No     Allergies   Patient has no known allergies.   Review of Systems Review of Systems  Constitutional: Negative for chills and fever.  HENT: Negative for ear pain and sore throat.   Eyes: Negative for pain and visual disturbance.  Respiratory: Negative for cough and shortness of breath.   Cardiovascular: Negative for chest pain and palpitations.  Gastrointestinal:  Negative for abdominal pain and vomiting.  Genitourinary: Negative for dysuria and hematuria.  Musculoskeletal: Positive for gait problem. Negative for arthralgias and back pain.  Skin: Negative for color change and rash.  Neurological: Negative for seizures and syncope.  All other systems reviewed and are negative.    Physical Exam Triage Vital Signs ED Triage Vitals [08/10/17 1033]  Enc Vitals Group     BP (!) 148/82     Pulse Rate 71     Resp 18     Temp 98.6 F (37 C)     Temp Source Oral     SpO2 100 %     Weight      Height      Head Circumference      Peak Flow      Pain Score 10     Pain Loc      Pain Edu?      Excl. in GC?    No data found.  Updated Vital Signs BP (!) 148/82 (BP Location: Left Arm)   Pulse 71   Temp 98.6 F (37 C) (Oral)   Resp 18   LMP  (Exact Date)   SpO2 100%   Visual Acuity Right Eye Distance:   Left Eye Distance:   Bilateral Distance:    Right Eye Near:   Left Eye Near:  Bilateral Near:     Physical Exam  Constitutional: She appears well-developed and well-nourished. No distress.  HENT:  Head: Normocephalic and atraumatic.  Mouth/Throat: Oropharynx is clear and moist.  Eyes: Pupils are equal, round, and reactive to light. Conjunctivae are normal.  Neck: Normal range of motion.  Cardiovascular: Normal rate.  Pulmonary/Chest: Effort normal. No respiratory distress.  Abdominal: Soft. She exhibits no distension.  Musculoskeletal: Normal range of motion. She exhibits no edema.  Fifth toe on right foot is swollen.  Very tender to touch.  Patient resists any range of motion.  Metatarsal is nontender.  Sensations intact.  X-ray shows a fracture.  Neurological: She is alert.  Skin: Skin is warm and dry.  Psychiatric: She has a normal mood and affect. Her behavior is normal.     UC Treatments / Results  Labs (all labs ordered are listed, but only abnormal results are displayed) Labs Reviewed - No data to  display  EKG None  Radiology Dg Foot Complete Right  Result Date: 08/10/2017 CLINICAL DATA:  Per pt: two days ago injured the right pinky toe, the other toes started swelling, hit a metal chair with the right foot. Predominant pain is the right pinky toe, radiating pain to the right forefoot, all other four toes are swol.*comment was truncated* EXAM: RIGHT FOOT COMPLETE - 3+ VIEW COMPARISON:  None. FINDINGS: There is a oblique fracture through the midshaft proximal phalanx fifth digit. IMPRESSION: Midshaft fracture of the proximal phalanx fifth digit. Electronically Signed   By: Genevive Bi M.D.   On: 08/10/2017 11:33    Procedures Procedures (including critical care time)  Medications Ordered in UC Medications - No data to display  Initial Impression / Assessment and Plan / UC Course  I have reviewed the triage vital signs and the nursing notes.  Pertinent labs & imaging results that were available during my care of the patient were reviewed by me and considered in my medical decision making (see chart for details).     I showed patient her films.  She has a nondisplaced toe fracture.  Discussed buddy taping and limited ambulation.  She is given a fracture shoe.  She is given a note to be out of work for couple days.  She may not be able to get into a shoe comfortably for a couple weeks.  She is to take ibuprofen for pain.  Return as needed3 Final Clinical Impressions(s) / UC Diagnoses   Final diagnoses:  Closed nondisplaced fracture of proximal phalanx of lesser toe of right foot, initial encounter     Discharge Instructions     Limit walking Elevate foot to reduce the swelling Wear fracture shoe for 2-3 weeks May wear shoe when you can fit into it comfortably Ibuprofen for pain as needed Take ibuprofen with food   ED Prescriptions    Medication Sig Dispense Auth. Provider   ibuprofen (ADVIL,MOTRIN) 800 MG tablet Take 1 tablet (800 mg total) by mouth every 8 (eight)  hours as needed for moderate pain. 90 tablet Eustace Moore, MD     Controlled Substance Prescriptions [place] Controlled Substance Registry consulted? Not Applicable   Eustace Moore, MD 08/10/17 [phone]

## 2017-08-10 NOTE — Discharge Instructions (Addendum)
Limit walking Elevate foot to reduce the swelling Wear fracture shoe for 2-3 weeks May wear shoe when you can fit into it comfortably Ibuprofen for pain as needed Take ibuprofen with food

## 2017-09-03 ENCOUNTER — Encounter (HOSPITAL_COMMUNITY): Payer: Self-pay | Admitting: Emergency Medicine

## 2017-09-03 ENCOUNTER — Emergency Department (HOSPITAL_COMMUNITY)
Admission: EM | Admit: 2017-09-03 | Discharge: 2017-09-03 | Disposition: A | Discharge status: Home / Self Care | Payer: Self-pay | Attending: Emergency Medicine | Admitting: Emergency Medicine

## 2017-09-03 DIAGNOSIS — F17210 Nicotine dependence, cigarettes, uncomplicated: Secondary | ICD-10-CM | POA: Insufficient documentation

## 2017-09-03 DIAGNOSIS — Z79899 Other long term (current) drug therapy: Secondary | ICD-10-CM | POA: Insufficient documentation

## 2017-09-03 DIAGNOSIS — F419 Anxiety disorder, unspecified: Secondary | ICD-10-CM

## 2017-09-03 DIAGNOSIS — I10 Essential (primary) hypertension: Secondary | ICD-10-CM | POA: Insufficient documentation

## 2017-09-03 DIAGNOSIS — Z760 Encounter for issue of repeat prescription: Secondary | ICD-10-CM

## 2017-09-03 MED ORDER — FLUOXETINE HCL 20 MG PO TABS: 20.0000 mg | ORAL_TABLET | Freq: Every day | ORAL | 0 refills | Status: DC

## 2017-09-03 NOTE — Discharge Instructions (Signed)
Return to ED if you start to develop worsening of your symptoms, chest pain, shortness of breath, coughing up blood, fever, changes to urination, thoughts of wanting to harm yourself or anyone else. Discontinue the use of ibuprofen.

## 2017-09-03 NOTE — ED Notes (Signed)
See EDP assessment. Pt verbalizes understanding of d/c instructions. Prescriptions reviewed with patient. Pt ambulatory at d/c with all belongings.  

## 2017-09-03 NOTE — ED Triage Notes (Signed)
Pt presents with daughter, states she has been having a lot of anxiety the past few weeks. Has been seen for same before but threw her medications away that they prescribed her.

## 2017-09-03 NOTE — ED Provider Notes (Signed)
[facility]   CSN: [id number] Arrival date & time: 09/03/17  2309     History   Chief Complaint Chief Complaint  Patient presents with  . Anxiety    HPI Rhonda Best is a 62 y.o. female with a past medical history of hypertension, presents to ED for evaluation of anxiety and wanting a refill for her Prozac.  States that she has been feeling worsening of her anxiety over the past year.  She is dealing with the loss of her husband and a close friend.  She reports decreased appetite and "crying spells every time I think about it."  She was prescribed Prozac in the past but states that she threw away the refill prescription.  She denies any SI, HI, AVH, chest pain, shortness of breath, hemoptysis, abdominal pain, urinary symptoms, fever, self injury.  She denies any prior suicide attempts.  She lives at home with her daughter.  HPI  Past Medical History:  Diagnosis Date  . Hypertension     There are no active problems to display for this patient.   History reviewed. No pertinent surgical history.   OB History   None      Home Medications    Prior to Admission medications   Medication Sig Start Date End Date Taking? Authorizing Provider  amLODipine (NORVASC) 10 MG tablet Take 1 tablet (10 mg total) by mouth daily. 03/11/16   Antony MaduraHumes, Kelly, PA-C  FLUoxetine (PROZAC) 20 MG tablet Take 1 tablet (20 mg total) by mouth daily. 09/03/17   Dietrich PatesKhatri, Nashira Mcglynn, PA-C    Family History Family History  Problem Relation Age of Onset  . Healthy Mother     Social History Social History   Tobacco Use  . Smoking status: Current Every Day Smoker    Packs/day: 0.05    Types: Cigarettes  . Smokeless tobacco: Never Used  Substance Use Topics  . Alcohol use: Yes    Comment: Socially   . Drug use: No     Allergies   Patient has no known allergies.   Review of Systems Review of Systems  Constitutional: Negative for appetite  change, chills and fever.  HENT: Negative for ear pain, rhinorrhea, sneezing and sore throat.   Eyes: Negative for photophobia and visual disturbance.  Respiratory: Negative for cough, chest tightness, shortness of breath and wheezing.   Cardiovascular: Negative for chest pain and palpitations.  Gastrointestinal: Negative for abdominal pain, blood in stool, constipation, diarrhea, nausea and vomiting.  Genitourinary: Negative for dysuria, hematuria and urgency.  Musculoskeletal: Negative for myalgias.  Skin: Negative for rash.  Neurological: Negative for dizziness, weakness and light-headedness.  Psychiatric/Behavioral: Negative for agitation, confusion and hallucinations. The patient is nervous/anxious. The patient is not hyperactive.      Physical Exam Updated Vital Signs BP (!) 150/90 (BP Location: Right Arm)   Pulse 83   Temp 98.6 F (37 C) (Oral)   Resp 16   Ht 5\' 3"  (1.6 m)   Wt 81.6 kg   SpO2 99%   BMI 31.89 kg/m   Physical Exam  Constitutional: She appears well-developed and well-nourished. No distress.  HENT:  Head: Normocephalic and atraumatic.  Nose: Nose normal.  Eyes: Conjunctivae and EOM are normal. Left eye exhibits no discharge. No scleral icterus.  Neck: Normal range of motion. Neck supple.  Cardiovascular: Normal rate, regular rhythm, normal heart sounds and intact distal pulses. Exam reveals no gallop and no friction rub.  No murmur heard.  Pulmonary/Chest: Effort normal and breath sounds normal. No respiratory distress.  Abdominal: Soft. Bowel sounds are normal. She exhibits no distension. There is no tenderness. There is no guarding.  Musculoskeletal: Normal range of motion. She exhibits no edema.  Neurological: She is alert. She exhibits normal muscle tone. Coordination normal.  Skin: Skin is warm and dry. No rash noted.  Psychiatric: She has a normal mood and affect.  Nursing note and vitals reviewed.    ED Treatments / Results  Labs (all labs  ordered are listed, but only abnormal results are displayed) Labs Reviewed - No data to display  EKG None  Radiology No results found.  Procedures Procedures (including critical care time)  Medications Ordered in ED Medications - No data to display   Initial Impression / Assessment and Plan / ED Course  I have reviewed the triage vital signs and the nursing notes.  Pertinent labs & imaging results that were available during my care of the patient were reviewed by me and considered in my medical decision making (see chart for details).     Patient presents to ED for evaluation of anxiety and wanting a refill for Prozac.  She threw away the prescription that was prescribed to her 2 months ago.  She denies any SI, HI, AVH.  Denies any chest pain, shortness of breath, hemoptysis, urinary symptoms, fever, abdominal pain. She endorses decreased appetite but no changes to urination.  She denies any prior suicide attempts.  She does not have a primary care provider to follow-up with.  Will prescribe refill for Prozac and give resource guide.  Will advise her to return to ED for any severe worsening symptoms. I do not believe there is any cardiac or neurological cause of her anxiety, she is denying all other symptoms and physical exam findings are unremarkable.  Portions of this note were generated with Scientist, clinical (histocompatibility and immunogenetics)Dragon dictation software. Dictation errors may occur despite best attempts at proofreading.   Final Clinical Impressions(s) / ED Diagnoses   Final diagnoses:  Anxiety  Medication refill    ED Discharge Orders         Ordered    FLUoxetine (PROZAC) 20 MG tablet  Daily     09/03/17 2336           Dietrich PatesKhatri, Konstantine Gervasi, PA-C 09/03/17 2345    Zadie RhineWickline, Donald, MD 09/04/17 [phone]

## 2017-09-25 ENCOUNTER — Encounter (HOSPITAL_COMMUNITY): Payer: Self-pay | Admitting: Emergency Medicine

## 2017-09-25 ENCOUNTER — Emergency Department (HOSPITAL_COMMUNITY)
Admission: EM | Admit: 2017-09-25 | Discharge: 2017-09-25 | Disposition: A | Discharge status: Home / Self Care | Payer: Self-pay | Attending: Emergency Medicine | Admitting: Emergency Medicine

## 2017-09-25 ENCOUNTER — Other Ambulatory Visit: Payer: Self-pay

## 2017-09-25 DIAGNOSIS — F419 Anxiety disorder, unspecified: Secondary | ICD-10-CM | POA: Insufficient documentation

## 2017-09-25 DIAGNOSIS — Z733 Stress, not elsewhere classified: Secondary | ICD-10-CM | POA: Insufficient documentation

## 2017-09-25 DIAGNOSIS — F17210 Nicotine dependence, cigarettes, uncomplicated: Secondary | ICD-10-CM | POA: Insufficient documentation

## 2017-09-25 DIAGNOSIS — I10 Essential (primary) hypertension: Secondary | ICD-10-CM | POA: Insufficient documentation

## 2017-09-25 DIAGNOSIS — Z79899 Other long term (current) drug therapy: Secondary | ICD-10-CM | POA: Insufficient documentation

## 2017-09-25 NOTE — ED Notes (Signed)
While in the room talking to the patient, pt states she has had SI thoughts and thoughts of harming herself. Pt stated she wishes she could "just pass away". Pt stated she had no plan because she hadn't thought that far in advanced.

## 2017-09-25 NOTE — ED Triage Notes (Addendum)
Pt st's she is having anxiety, crying all the time and not eating.  Pt is currently taking Prozac but only takes maybe once a day.  Pt st's she is not suicidal but if that will get her admitted then we need to put that in her chart.  Pt st's she just needs a few days of rest

## 2017-09-25 NOTE — ED Provider Notes (Signed)
[facility] EMERGENCY DEPARTMENT Provider Note   CSN: [id number] Arrival date & time: 09/25/17  1516     History   Chief Complaint Chief Complaint  Patient presents with  . Anxiety    HPI Rhonda Best is a 62 y.o. female.  HPI   62 year old female presents today with complaints of anxiety. Patient notes that over the last several months she's had several episodes of crying spells. She notes out of the blue she will be very upset, crying and inconsolable. Patient notes that she works a lot and feels stressed out. She notes she has been seen several times for this, she notes taking Lexapro every several days. Patient notes she does enjoy smoking cigarettes and "drinking gin and juice", other than that she finds no enjoyment. She denies any physical complaints presently.  Past Medical History:  Diagnosis Date  . Hypertension     There are no active problems to display for this patient.   History reviewed. No pertinent surgical history.   OB History   None      Home Medications    Prior to Admission medications   Medication Sig Start Date End Date Taking? Authorizing Provider  amLODipine (NORVASC) 10 MG tablet Take 1 tablet (10 mg total) by mouth daily. 03/11/16   Antony Madura, PA-C  FLUoxetine (PROZAC) 20 MG tablet Take 1 tablet (20 mg total) by mouth daily. 09/03/17   Dietrich Pates, PA-C    Family History Family History  Problem Relation Age of Onset  . Healthy Mother     Social History Social History   Tobacco Use  . Smoking status: Current Every Day Smoker    Packs/day: 0.05    Types: Cigarettes  . Smokeless tobacco: Never Used  Substance Use Topics  . Alcohol use: Yes    Comment: Socially   . Drug use: No     Allergies   Patient has no known allergies.   Review of Systems Review of Systems  All other systems reviewed and are negative.    Physical Exam Updated Vital Signs BP (!) 174/100 (BP Location: Right Arm)    Pulse 78   Temp 98.2 F (36.8 C) (Oral)   Resp 16   Ht 5\' 4"  (1.626 m)   Wt 63.5 kg   SpO2 100%   BMI 24.03 kg/m   Physical Exam  Constitutional: She is oriented to person, place, and time. She appears well-developed and well-nourished.  HENT:  Head: Normocephalic and atraumatic.  Eyes: Pupils are equal, round, and reactive to light. Conjunctivae are normal. Right eye exhibits no discharge. Left eye exhibits no discharge. No scleral icterus.  Neck: Normal range of motion. No JVD present. No tracheal deviation present.  Pulmonary/Chest: Effort normal. No stridor.  Neurological: She is alert and oriented to person, place, and time. Coordination normal.  Psychiatric: She has a normal mood and affect. Her behavior is normal. Judgment and thought content normal.  Nursing note and vitals reviewed.    ED Treatments / Results  Labs (all labs ordered are listed, but only abnormal results are displayed) Labs Reviewed - No data to display  EKG None  Radiology No results found.  Procedures Procedures (including critical care time)  Medications Ordered in ED Medications - No data to display   Initial Impression / Assessment and Plan / ED Course  I have reviewed the triage vital signs and the nursing notes.  Pertinent labs & imaging results that were available during my care  of the patient were reviewed by me and considered in my medical decision making (see chart for details).     62 year old female presents today with anxiety. He presently is in no acute distress. I have discussed outpatient follow up with Loraine Leriche, the patient would like to follow up and speak with a therapist. She is given strict and cautions, she verbalized understanding and agreement to today's plan had no further questions or concerns.  Final Clinical Impressions(s) / ED Diagnoses   Final diagnoses:  Anxiety    ED Discharge Orders    None       Rosalio Loud 09/25/17 2104    Sabas Sous, MD 09/25/17 2314

## 2017-09-25 NOTE — Discharge Instructions (Addendum)
Please read attached information. If you experience any new or worsening signs or symptoms please return to the emergency room for evaluation. Please follow-up with your primary care provider or specialist as discussed.  °

## 2017-09-29 ENCOUNTER — Encounter (HOSPITAL_COMMUNITY): Payer: Self-pay

## 2017-09-29 ENCOUNTER — Emergency Department (HOSPITAL_COMMUNITY): Payer: Self-pay

## 2017-09-29 ENCOUNTER — Other Ambulatory Visit: Payer: Self-pay

## 2017-09-29 ENCOUNTER — Emergency Department (HOSPITAL_COMMUNITY)
Admission: EM | Admit: 2017-09-29 | Discharge: 2017-09-30 | Disposition: A | Discharge status: Home / Self Care | Payer: Self-pay | Attending: Emergency Medicine | Admitting: Emergency Medicine

## 2017-09-29 ENCOUNTER — Encounter (HOSPITAL_COMMUNITY): Payer: Self-pay | Admitting: Emergency Medicine

## 2017-09-29 ENCOUNTER — Ambulatory Visit (HOSPITAL_COMMUNITY)
Admission: EM | Admit: 2017-09-29 | Discharge: 2017-09-29 | Disposition: A | Discharge status: Home / Self Care | Payer: Self-pay | Attending: Family Medicine | Admitting: Family Medicine

## 2017-09-29 DIAGNOSIS — I10 Essential (primary) hypertension: Secondary | ICD-10-CM

## 2017-09-29 DIAGNOSIS — E876 Hypokalemia: Secondary | ICD-10-CM | POA: Insufficient documentation

## 2017-09-29 DIAGNOSIS — R42 Dizziness and giddiness: Secondary | ICD-10-CM

## 2017-09-29 DIAGNOSIS — F17210 Nicotine dependence, cigarettes, uncomplicated: Secondary | ICD-10-CM | POA: Insufficient documentation

## 2017-09-29 DIAGNOSIS — Z79899 Other long term (current) drug therapy: Secondary | ICD-10-CM | POA: Insufficient documentation

## 2017-09-29 NOTE — ED Notes (Signed)
Aggie Cosier, cma spoke to providers about this patient

## 2017-09-29 NOTE — ED Notes (Signed)
Pt refuses blood work in triage, sent back to lobby

## 2017-09-29 NOTE — ED Triage Notes (Signed)
Pt states she has been dizzy for a week or more.

## 2017-09-29 NOTE — ED Provider Notes (Signed)
Patient to triage with complaints of dizziness for the past week. Nursing reports Bps in 200's. Patient unable to recall specifically last dose of BP medications, states maybe 4 days ago but maybe has been a month. Order placed for EKG to rule out ACS then recommended assist to ER for treatment of symptomatic htn. Nursing to assist to ER.   EKG NSR rate 89 without acute stchanges noted   Georgetta Haber, NP 09/29/2017 4:07 PM    Georgetta Haber, NP 09/29/17 1617

## 2017-09-29 NOTE — ED Triage Notes (Signed)
Pt c/o chest pain x 1 hour with nausea/vomiting and dizziness. Also c/o HTN, hx of same, reports that she is out of her medications.

## 2017-09-30 LAB — CBC WITH DIFFERENTIAL/PLATELET
Abs Immature Granulocytes: 0 10*3/uL (ref 0.0–0.1)
BASOS PCT: 0 %
Basophils Absolute: 0 10*3/uL (ref 0.0–0.1)
EOS ABS: 0.1 10*3/uL (ref 0.0–0.7)
Eosinophils Relative: 3 %
HCT: 40.8 % (ref 36.0–46.0)
Hemoglobin: 13.8 g/dL (ref 12.0–15.0)
Immature Granulocytes: 0 %
Lymphocytes Relative: 45 %
Lymphs Abs: 2.3 10*3/uL (ref 0.7–4.0)
MCH: 33 pg (ref 26.0–34.0)
MCHC: 33.8 g/dL (ref 30.0–36.0)
MCV: 97.6 fL (ref 78.0–100.0)
MONO ABS: 0.6 10*3/uL (ref 0.1–1.0)
MONOS PCT: 12 %
NEUTROS PCT: 40 %
Neutro Abs: 2.1 10*3/uL (ref 1.7–7.7)
PLATELETS: 248 10*3/uL (ref 150–400)
RBC: 4.18 MIL/uL (ref 3.87–5.11)
RDW: 14.1 % (ref 11.5–15.5)
WBC: 5.2 10*3/uL (ref 4.0–10.5)

## 2017-09-30 LAB — COMPREHENSIVE METABOLIC PANEL
ALK PHOS: 84 U/L (ref 38–126)
ALT: 26 U/L (ref 0–44)
ANION GAP: 16 — AB (ref 5–15)
AST: 33 U/L (ref 15–41)
Albumin: 4.1 g/dL (ref 3.5–5.0)
BUN: <5 mg/dL — ABNORMAL LOW (ref 6–20)
CALCIUM: 9.3 mg/dL (ref 8.9–10.3)
CO2: 23 mmol/L (ref 22–32)
CREATININE: 0.84 mg/dL (ref 0.44–1.00)
Chloride: 97 mmol/L — ABNORMAL LOW (ref 98–111)
GFR calc non Af Amer: >60 mL/min (ref >60)
GLUCOSE: 108 mg/dL — AB (ref 70–99)
Potassium: 3.3 mmol/L — ABNORMAL LOW (ref 3.5–5.1)
SODIUM: 136 mmol/L (ref 135–145)
Total Bilirubin: 1.4 mg/dL — ABNORMAL HIGH (ref 0.3–1.2)
Total Protein: 7.4 g/dL (ref 6.5–8.1)

## 2017-09-30 LAB — I-STAT TROPONIN, ED: TROPONIN I, POC: 0.01 ng/mL (ref 0.00–0.08)

## 2017-09-30 LAB — I-STAT BETA HCG BLOOD, ED (MC, WL, AP ONLY): I-stat hCG, quantitative: <5 m[IU]/mL (ref <5)

## 2017-09-30 MED ORDER — POTASSIUM CHLORIDE CRYS ER 20 MEQ PO TBCR: 20.0000 meq | EXTENDED_RELEASE_TABLET | Freq: Two times a day (BID) | ORAL | 0 refills | Status: DC

## 2017-09-30 MED ORDER — AMLODIPINE BESYLATE 10 MG PO TABS: 10.0000 mg | ORAL_TABLET | Freq: Every day | ORAL | 0 refills | Status: DC

## 2017-09-30 MED ORDER — POTASSIUM CHLORIDE CRYS ER 20 MEQ PO TBCR
40.0000 meq | EXTENDED_RELEASE_TABLET | Freq: Once | ORAL | Status: AC
  Administered 2017-09-30: 40 meq via ORAL
  Filled 2017-09-30: qty 2

## 2017-09-30 MED ORDER — AMLODIPINE BESYLATE 5 MG PO TABS
10.0000 mg | ORAL_TABLET | Freq: Once | ORAL | Status: AC
  Administered 2017-09-30: 10 mg via ORAL
  Filled 2017-09-30: qty 2

## 2017-09-30 MED ORDER — ONDANSETRON 4 MG PO TBDP
8.0000 mg | ORAL_TABLET | Freq: Once | ORAL | Status: DC
  Filled 2017-09-30: qty 2

## 2017-09-30 NOTE — Discharge Instructions (Addendum)
Please monitor your blood pressure at home.  Take it once a day and keep a record of it.  Take that record with you when you see your primary care provider.

## 2017-09-30 NOTE — ED Notes (Signed)
Reviewed d/c instructions with pt, who verbalized understanding and had no outstanding questions. Pt departed in NAD, refused use of wheelchair.   

## 2017-09-30 NOTE — ED Provider Notes (Signed)
[facility] EMERGENCY DEPARTMENT Provider Note   CSN: [id number] Arrival date & time: 09/29/17  1625     History   Chief Complaint Chief Complaint  Patient presents with  . Chest Pain    HPI Rhonda Best is a 62 y.o. female.  The history is provided by the patient.  She has history of hypertension and comes in complaining of dizziness today.  She states she ran out of her blood pressure medicine 4 days ago.  Since then, she has had some anorexia.  She had one episode of chest pain tonight at about 7 PM.  She described a dull ache in the left anterior chest which lasted about 1 minute.  She rated the pain at 5/10.  There is no associated dyspnea, nausea, diaphoresis.  Dizziness is not affected by body position or movement.  She denies vertigo.  She denies headache, tinnitus, epistaxis.  Past Medical History:  Diagnosis Date  . Hypertension     There are no active problems to display for this patient.   History reviewed. No pertinent surgical history.   OB History   None      Home Medications    Prior to Admission medications   Medication Sig Start Date End Date Taking? Authorizing Provider  amLODipine (NORVASC) 10 MG tablet Take 1 tablet (10 mg total) by mouth daily. 03/11/16   Antony Madura, PA-C  FLUoxetine (PROZAC) 20 MG tablet Take 1 tablet (20 mg total) by mouth daily. 09/03/17   Dietrich Pates, PA-C    Family History Family History  Problem Relation Age of Onset  . Healthy Mother     Social History Social History   Tobacco Use  . Smoking status: Current Every Day Smoker    Packs/day: 0.05    Types: Cigarettes  . Smokeless tobacco: Never Used  Substance Use Topics  . Alcohol use: Yes    Comment: Socially   . Drug use: No     Allergies   Patient has no known allergies.   Review of Systems Review of Systems  All other systems reviewed and are negative.    Physical Exam Updated Vital Signs BP (!) 203/98 (BP Location:  Right Arm)   Pulse 83   Temp 98.6 F (37 C) (Oral)   Resp 18   SpO2 100%   Physical Exam  Nursing note and vitals reviewed.  62 year old female, resting comfortably and in no acute distress. Vital signs are significant for elevated blood pressure. Oxygen saturation is 100%, which is normal. Head is normocephalic and atraumatic. PERRLA, EOMI. Oropharynx is clear.  Fundi show no hemorrhage, exudate, papilledema. Neck is nontender and supple without adenopathy or JVD. Back is nontender and there is no CVA tenderness. Lungs are clear without rales, wheezes, or rhonchi. Chest is nontender. Heart has regular rate and rhythm without murmur. Abdomen is soft, flat, nontender without masses or hepatosplenomegaly and peristalsis is normoactive. Extremities have no cyanosis or edema, full range of motion is present. Skin is warm and dry without rash. Neurologic: Mental status is normal, cranial nerves are intact, there are no motor or sensory deficits.  Dizziness is not reproduced by vestibular stimulation.  ED Treatments / Results  Labs (all labs ordered are listed, but only abnormal results are displayed) Labs Reviewed  COMPREHENSIVE METABOLIC PANEL - Abnormal; Notable for the following components:      Result Value   Potassium 3.3 (*)    Chloride 97 (*)    Glucose,  Bld 108 (*)    BUN <5 (*)    Total Bilirubin 1.4 (*)    Anion gap 16 (*)    All other components within normal limits  CBC WITH DIFFERENTIAL/PLATELET  I-STAT TROPONIN, ED  I-STAT BETA HCG BLOOD, ED ([facility], [facility], [facility] ONLY)    EKG EKG Interpretation  Date/Time:  Monday September 29 2017 16:37:41 EDT Ventricular Rate:  85 PR Interval:  130 QRS Duration: 76 QT Interval:  372 QTC Calculation: 442 R Axis:   74 Text Interpretation:  Normal sinus rhythm Cannot rule out Anterior infarct , age undetermined Abnormal ECG When compared with ECG of EARLIER SAME DATE No significant change was found Confirmed by Dione Booze ([id number]) on  09/30/2017 2:38:34 AM   Radiology Dg Chest 2 View  Result Date: 09/29/2017 CLINICAL DATA:  Chest tightness, short of breath EXAM: CHEST - 2 VIEW COMPARISON:  03/10/2016 FINDINGS: The heart size and mediastinal contours are within normal limits. Both lungs are clear. The visualized skeletal structures are unremarkable. IMPRESSION: No active cardiopulmonary disease. Electronically Signed   By: Jasmine Pang M.D.   On: 09/29/2017 16:56    Procedures Procedures   Medications Ordered in ED Medications  ondansetron (ZOFRAN-ODT) disintegrating tablet 8 mg (0 mg Oral Hold 09/30/17 0315)  potassium chloride SA (K-DUR,KLOR-CON) CR tablet 40 mEq (has no administration in time range)  amLODipine (NORVASC) tablet 10 mg (10 mg Oral Given 09/30/17 0316)     Initial Impression / Assessment and Plan / ED Course  I have reviewed the triage vital signs and the nursing notes.  Pertinent labs & imaging results that were available during my care of the patient were reviewed by me and considered in my medical decision making (see chart for details).  Elevated blood pressure probably secondary to medication noncompliance.  Dizziness of uncertain cause, no evidence of vertigo.  Old records are reviewed, and she does have prior ED visits for blood pressure.  Last seen in the emergency department on August 14 and blood pressure was 150/90 at that time.  She is given a dose of her amlodipine.  She likely just needs to be restarted on her medications.  Will check screening labs.  4:26 AM Labs show mild hypokalemia and minimal hyperbilirubinemia.  Blood pressure has come down to 145/87.  She is felt to be safe for discharge.  She is discharged with prescription for amlodipine and also a 5 day course of K-Dur.  Advised to monitor her blood pressure at home.  She needs to become established with a primary care provider.  Final Clinical Impressions(s) / ED Diagnoses   Final diagnoses:  Dizziness  Elevated blood  pressure reading with diagnosis of hypertension  Hypokalemia    ED Discharge Orders         Ordered    amLODipine (NORVASC) 10 MG tablet  Daily     09/30/17 0425    potassium chloride SA (K-DUR,KLOR-CON) 20 MEQ tablet  2 times daily     09/30/17 0425           Dione Booze, MD 09/30/17 [phone]

## 2017-10-01 ENCOUNTER — Encounter (HOSPITAL_COMMUNITY): Payer: Self-pay

## 2017-10-01 ENCOUNTER — Emergency Department (HOSPITAL_COMMUNITY)
Admission: EM | Admit: 2017-10-01 | Discharge: 2017-10-01 | Disposition: A | Discharge status: Home / Self Care | Payer: Self-pay | Attending: Emergency Medicine | Admitting: Emergency Medicine

## 2017-10-01 ENCOUNTER — Other Ambulatory Visit: Payer: Self-pay

## 2017-10-01 ENCOUNTER — Emergency Department (HOSPITAL_COMMUNITY): Payer: Self-pay

## 2017-10-01 DIAGNOSIS — R072 Precordial pain: Secondary | ICD-10-CM | POA: Insufficient documentation

## 2017-10-01 DIAGNOSIS — F17210 Nicotine dependence, cigarettes, uncomplicated: Secondary | ICD-10-CM | POA: Insufficient documentation

## 2017-10-01 DIAGNOSIS — I10 Essential (primary) hypertension: Secondary | ICD-10-CM | POA: Insufficient documentation

## 2017-10-01 LAB — CBC
HCT: 41.2 % (ref 36.0–46.0)
HEMOGLOBIN: 14.1 g/dL (ref 12.0–15.0)
MCH: 33.5 pg (ref 26.0–34.0)
MCHC: 34.2 g/dL (ref 30.0–36.0)
MCV: 97.9 fL (ref 78.0–100.0)
Platelets: 340 10*3/uL (ref 150–400)
RBC: 4.21 MIL/uL (ref 3.87–5.11)
RDW: 14 % (ref 11.5–15.5)
WBC: 5.2 10*3/uL (ref 4.0–10.5)

## 2017-10-01 LAB — BASIC METABOLIC PANEL
ANION GAP: 14 (ref 5–15)
BUN: 7 mg/dL (ref 6–20)
CALCIUM: 9.4 mg/dL (ref 8.9–10.3)
CO2: 22 mmol/L (ref 22–32)
Chloride: 103 mmol/L (ref 98–111)
Creatinine, Ser: 0.87 mg/dL (ref 0.44–1.00)
GFR calc Af Amer: >60 mL/min (ref >60)
GFR calc non Af Amer: >60 mL/min (ref >60)
Glucose, Bld: 101 mg/dL — ABNORMAL HIGH (ref 70–99)
Potassium: 3.8 mmol/L (ref 3.5–5.1)
Sodium: 139 mmol/L (ref 135–145)

## 2017-10-01 LAB — I-STAT BETA HCG BLOOD, ED (MC, WL, AP ONLY): I-stat hCG, quantitative: <5 m[IU]/mL (ref <5)

## 2017-10-01 LAB — I-STAT TROPONIN, ED: TROPONIN I, POC: 0.02 ng/mL (ref 0.00–0.08)

## 2017-10-01 MED ORDER — IBUPROFEN 800 MG PO TABS: 800.0000 mg | ORAL_TABLET | Freq: Once | ORAL | Status: DC

## 2017-10-01 NOTE — ED Notes (Signed)
Patient verbalizes understanding of discharge instructions. Opportunity for questioning and answers were provided. Armband removed by staff, pt discharged from ED.  

## 2017-10-01 NOTE — ED Triage Notes (Signed)
Pt. Was here on Monday for CP and HTN.  she continues to have chest pain/tightness and her BP is still elevated   She took er BP medication this morning. Pt. Denies any n/v/d/   Pt. Denies any headaches,  Alert and oriented X4.Rhonda Best She was nervous  Cause her BP was high at home.

## 2017-10-01 NOTE — Discharge Instructions (Signed)

## 2017-10-01 NOTE — ED Provider Notes (Signed)
Emergency Department Provider Note   I have reviewed the triage vital signs and the nursing notes.   HISTORY  Chief Complaint Chest Pain   HPI Rhonda Best is a 62 y.o. female with PMH of HTN presents to the ED with chest tightness that returned today and continued elevated BP at home. Patient reports being compliant with her Amlodipine with no decrease in her BP. She takes her pressures at home with a wrist BP monitor. No SOB. No fever or chills. No productive cough. No radiation of symptoms or modifying factors.    Past Medical History:  Diagnosis Date  . Hypertension     There are no active problems to display for this patient.   History reviewed. No pertinent surgical history.  Allergies Patient has no known allergies.  Family History  Problem Relation Age of Onset  . Healthy Mother     Social History Social History   Tobacco Use  . Smoking status: Current Every Day Smoker    Packs/day: 0.05    Types: Cigarettes  . Smokeless tobacco: Never Used  Substance Use Topics  . Alcohol use: Yes    Comment: Socially   . Drug use: No    Review of Systems  Constitutional: No fever/chills Eyes: No visual changes. ENT: No sore throat. Cardiovascular: Positive chest tightness. Positive elevated BP.  Respiratory: Denies shortness of breath. Gastrointestinal: No abdominal pain.  No nausea, no vomiting.  No diarrhea.  No constipation. Genitourinary: Negative for dysuria. Musculoskeletal: Negative for back pain. Skin: Negative for rash. Neurological: Negative for headaches, focal weakness or numbness.  10-point ROS otherwise negative.  ____________________________________________   PHYSICAL EXAM:  VITAL SIGNS: ED Triage Vitals  Enc Vitals Group     BP 10/01/17 1152 (!) 160/108     Pulse Rate 10/01/17 1152 91     Resp 10/01/17 1152 18     Temp 10/01/17 1152 98.3 F (36.8 C)     Temp Source 10/01/17 1152 Oral     SpO2 10/01/17 1152 100 %   Weight 10/01/17 1154 138 lb 14.2 oz (63 kg)     Height 10/01/17 1154 5\' 4"  (1.626 m)     Pain Score 10/01/17 1153 0   Constitutional: Alert and oriented. Well appearing and in no acute distress. Eyes: Conjunctivae are normal.  Head: Atraumatic. Nose: No congestion/rhinnorhea. Mouth/Throat: Mucous membranes are moist.  Oropharynx non-erythematous. Neck: No stridor.   Cardiovascular: Normal rate, regular rhythm. Good peripheral circulation. Grossly normal heart sounds.   Respiratory: Normal respiratory effort.  No retractions. Lungs CTAB. Gastrointestinal: Soft and nontender. No distention.  Musculoskeletal: No lower extremity tenderness nor edema. No gross deformities of extremities. Neurologic:  Normal speech and language. No gross focal neurologic deficits are appreciated.  Skin:  Skin is warm, dry and intact. No rash noted.  ____________________________________________   LABS (all labs ordered are listed, but only abnormal results are displayed)  Labs Reviewed  BASIC METABOLIC PANEL - Abnormal; Notable for the following components:      Result Value   Glucose, Bld 101 (*)    All other components within normal limits  CBC  I-STAT TROPONIN, ED  I-STAT BETA HCG BLOOD, ED ([facility], [facility], [facility] ONLY)   ____________________________________________  EKG   EKG Interpretation  Date/Time:  Wednesday October 01 2017 11:53:15 EDT Ventricular Rate:  80 PR Interval:  120 QRS Duration: 72 QT Interval:  370 QTC Calculation: 426 R Axis:   76 Text Interpretation:  Normal sinus rhythm Normal  ECG No STEMI.  Confirmed by Alona Bene [id number]) on 10/01/2017 3:44:06 PM       ____________________________________________  RADIOLOGY  Dg Chest 2 View  Result Date: 10/01/2017 CLINICAL DATA:  Chest pain starting September 29, 2017 EXAM: CHEST - 2 VIEW COMPARISON:  September 29, 2017 FINDINGS: The heart size and mediastinal contours are within normal limits. Both lungs are clear. The visualized  skeletal structures are unremarkable. IMPRESSION: No active cardiopulmonary disease. Electronically Signed   By: Sherian Rein M.D.   On: 10/01/2017 18:01    ____________________________________________   PROCEDURES  Procedure(s) performed:   Procedures  None ____________________________________________   INITIAL IMPRESSION / ASSESSMENT AND PLAN / ED COURSE  Pertinent labs & imaging results that were available during my care of the patient were reviewed by me and considered in my medical decision making (see chart for details).  Patient presents to the emergency department for evaluation of chest tightness and elevated blood pressure.  She has had so emergency department evaluations for blood pressure in the recent past.  EKG reviewed with no acute ischemic findings.  Labs from triage show improved potassium as well as normal troponin.  Chest pain is been constant for more than 6 hours.  Very low suspicion for acute coronary syndrome or PE.  Discussed the emergency department management of blood pressure and need for PCP follow up from here. Had patient take her BP with home meter at bedside which gave a systolic reading nearly 60 points higher that ED BP cuff.  No evidence of hypertension emergency.  For repeat chest x-ray and likely discharge home with PCP follow-up.  At this time, I do not feel there is any life-threatening condition present. I have reviewed and discussed all results (EKG, imaging, lab, urine as appropriate), exam findings with patient. I have reviewed nursing notes and appropriate previous records.  I feel the patient is safe to be discharged home without further emergent workup. Discussed usual and customary return precautions. Patient and family (if present) verbalize understanding and are comfortable with this plan.  Patient will follow-up with their primary care provider. If they do not have a primary care provider, information for follow-up has been provided to them.  All questions have been answered.  ____________________________________________  FINAL CLINICAL IMPRESSION(S) / ED DIAGNOSES  Final diagnoses:  Precordial chest pain  Essential hypertension    Note:  This document was prepared using Dragon voice recognition software and may include unintentional dictation errors.  Alona Bene, MD Emergency Medicine    Long, Arlyss Repress, MD 10/02/17 [phone]

## 2017-11-05 ENCOUNTER — Ambulatory Visit: Payer: Self-pay | Admitting: Family Medicine

## 2017-11-10 ENCOUNTER — Ambulatory Visit (INDEPENDENT_AMBULATORY_CARE_PROVIDER_SITE_OTHER): Payer: Self-pay | Admitting: Family Medicine

## 2017-11-10 ENCOUNTER — Encounter: Payer: Self-pay | Admitting: Family Medicine

## 2017-11-10 VITALS — BP 134/84 | HR 70 | Temp 98.2°F | Resp 17 | Ht 63.0 in | Wt 161.2 lb

## 2017-11-10 DIAGNOSIS — Z130 Encounter for screening for diseases of the blood and blood-forming organs and certain disorders involving the immune mechanism: Secondary | ICD-10-CM

## 2017-11-10 DIAGNOSIS — Z634 Disappearance and death of family member: Secondary | ICD-10-CM

## 2017-11-10 DIAGNOSIS — F329 Major depressive disorder, single episode, unspecified: Secondary | ICD-10-CM

## 2017-11-10 DIAGNOSIS — F419 Anxiety disorder, unspecified: Secondary | ICD-10-CM

## 2017-11-10 DIAGNOSIS — Z13220 Encounter for screening for lipoid disorders: Secondary | ICD-10-CM

## 2017-11-10 DIAGNOSIS — Z131 Encounter for screening for diabetes mellitus: Secondary | ICD-10-CM

## 2017-11-10 DIAGNOSIS — F4321 Adjustment disorder with depressed mood: Secondary | ICD-10-CM

## 2017-11-10 DIAGNOSIS — I10 Essential (primary) hypertension: Secondary | ICD-10-CM

## 2017-11-10 DIAGNOSIS — F4329 Adjustment disorder with other symptoms: Secondary | ICD-10-CM

## 2017-11-10 MED ORDER — FLUOXETINE HCL 20 MG PO TABS: 20.0000 mg | ORAL_TABLET | Freq: Every day | ORAL | 1 refills | Status: DC

## 2017-11-10 MED ORDER — AMLODIPINE BESYLATE 10 MG PO TABS: 10.0000 mg | ORAL_TABLET | Freq: Every day | ORAL | 1 refills | Status: DC

## 2017-11-10 MED ORDER — CLONIDINE HCL 0.1 MG PO TABS
0.1000 mg | ORAL_TABLET | Freq: Once | ORAL | Status: AC
  Administered 2017-11-10: 0.1 mg via ORAL

## 2017-11-10 MED ORDER — BUSPIRONE HCL 10 MG PO TABS: 10.0000 mg | ORAL_TABLET | Freq: Three times a day (TID) | ORAL | 0 refills | Status: DC

## 2017-11-10 MED FILL — AMLODIPINE BESYLATE 10 MG T: 10 | 30 days supply | Qty: 30 | Fill #0

## 2017-11-10 MED FILL — busPIRone HCL 10 MG TABS: 10 | 30 days supply | Qty: 90 | Fill #0

## 2017-11-10 NOTE — Patient Instructions (Addendum)
To have labs drawn, go to:  [facility]  [address] [place], [place] [address] [phone]    Thank you for choosing Primary Care at [facility] for your medical home!    Avon P Belknap was seen by Joaquin Courts, FNP today.   Williams Che Shibuya's primary care  is Bing Neighbors, FNP.   For the best care possible,  you should try to see Joaquin Courts, FNP  whenever you come to clinic.   We look forward to seeing you again soon!  If you have any questions about your visit today,  please call us at   Or feel free to reach your provider via MyChart.

## 2017-11-10 NOTE — Progress Notes (Signed)
Rhonda Best, is a 62 y.o. female  WUJ:[id number]  NFA:[id number]  DOB - 1955-08-08  CC:  Chief Complaint  Patient presents with  . Establish Care  . Hypertension    BP readings have been high at home. watching salt intake. no chest pain, SHOB, lower extremity swelling.        HPI: Rhonda Best establish care.   Rhonda Best has Essential hypertension; Current episode of major depressive disorder without prior episode; and Anxiety on their problem list.    Today's visit:  Rhonda Best is a 62 y.o. female is here today to establish care. Patient presented to the the ER and UC on three separate occasion in September with a complaint of elevated blood pressure, dizziness, and lastly chest pain.  She was found to be hypertensive and advised to follow-up with primary care to establish and have blood pressure evaluated.  Follow-up was recommended in early September patient presents today to establish.  No home monitoring of blood pressure.  Is a current every day smoker.  Does not receive routine medical care as she is uninsured.  Has intermittent headaches and occasional dizziness.  Denies chest pain, swelling, shortness of breath, weakness, or numbness and tingling.  Anxiety and Depression Rhonda Best is also concerned with her mood.  She reports that she has been increasingly depressed, anxious, and worried.  Has suffered recent also family, she reports her children's father passed away and then she suddenly lost her best friend.  She reports episodes of uncontrollable crying, fatigue, and not resting.  Denies thoughts of suicide, homicidal ideations, or auditory hallucinations. Prior to these deaths occurring she denies experiencing any mental health disease.   Current medications:No current outpatient medications on file.   Pertinent family medical history: family history includes Healthy in her mother.   No Known Allergies  Social History   Socioeconomic History  . Marital  status: Single    Spouse name: Not on file  . Number of children: Not on file  . Years of education: Not on file  . Highest education level: Not on file  Occupational History  . Not on file  Social Needs  . Financial resource strain: Not on file  . Food insecurity:    Worry: Not on file    Inability: Not on file  . Transportation needs:    Medical: Not on file    Non-medical: Not on file  Tobacco Use  . Smoking status: Current Every Day Smoker    Packs/day: 0.05    Types: Cigarettes  . Smokeless tobacco: Never Used  Substance and Sexual Activity  . Alcohol use: Yes    Comment: Socially   . Drug use: No  . Sexual activity: Not on file  Lifestyle  . Physical activity:    Days per week: Not on file    Minutes per session: Not on file  . Stress: Not on file  Relationships  . Social connections:    Talks on phone: Not on file    Gets together: Not on file    Attends religious service: Not on file    Active member of club or organization: Not on file    Attends meetings of clubs or organizations: Not on file    Relationship status: Not on file  . Intimate partner violence:    Fear of current or ex partner: Not on file    Emotionally abused: Not on file    Physically abused: Not on file    Forced sexual  activity: Not on file  Other Topics Concern  . Not on file  Social History Narrative  . Not on file    Review of Systems: Constitutional: Negative for fever, chills, diaphoresis, activity change, appetite change and fatigue. HENT: Negative for ear pain, nosebleeds, congestion, facial swelling, rhinorrhea, neck pain, neck stiffness and ear discharge.  Eyes: Negative for pain, discharge, redness, itching and visual disturbance. Respiratory: Negative for cough, choking, chest tightness, shortness of breath, wheezing and stridor.  Cardiovascular: Negative for chest pain, palpitations and leg swelling. Gastrointestinal: Negative for abdominal distention. Neurological:  Negative for dizziness, tremors, seizures, syncope, facial asymmetry, speech difficulty, weakness, light-headedness, numbness. Positive headaches.  Hematological: Negative for adenopathy. Does not bruise/bleed easily. Psychiatric/Behavioral: Negative for hallucinations, behavioral problems, confusion, dysphoric mood, decreased concentration and agitation.   Objective:   Vitals:   11/10/17 1401  BP: (!) 155/92  Pulse: 70  Resp: 17  Temp: 98.2 F (36.8 C)  SpO2: 97%    BP Readings from Last 3 Encounters:  11/10/17 (!) 155/92  10/01/17 (!) 183/97  09/30/17 (!) 165/100    Filed Weights   11/10/17 1401  Weight: 161 lb 3.2 oz (73.1 kg)      Depression screen [facility] 2/9 11/10/2017 10/23/2015  Decreased Interest 0 0  Down, Depressed, Hopeless 1 0  PHQ - 2 Score 1 0  Altered sleeping 0 -  Tired, decreased energy 0 -  Change in appetite 1 -  Feeling bad or failure about yourself  0 -  Trouble concentrating 0 -  Moving slowly or fidgety/restless 0 -  Suicidal thoughts 0 -  PHQ-9 Score 2 -    Physical Exam: Constitutional: Patient appears well-developed and well-nourished. No distress. HENT: Normocephalic, atraumatic, External right and left ear normal. Oropharynx is clear and moist.  Eyes: Conjunctivae and EOM are normal. PERRLA, no scleral icterus. Neck: Normal ROM. Neck supple. No JVD. No tracheal deviation. No thyromegaly. CVS: RRR, S1/S2 +, no murmurs, no gallops, no carotid bruit.  Pulmonary: Effort and breath sounds normal, no stridor, rhonchi, wheezes, rales.  Abdominal: Soft. BS +, no distension, tenderness, rebound or guarding.  Musculoskeletal: Normal range of motion. No edema and no tenderness.  Neuro: Alert. Normal muscle tone coordination. Normal gait. BUE and BLE strength 5/5. Bilateral hand grips symmetrical. Skin: Skin is warm and dry. No rash noted. Not diaphoretic. No erythema. No pallor. Psychiatric: Depressed mood, flat affect and affect. Behavior, judgment,  thought content normal. Lab Results (prior encounters)  Lab Results  Component Value Date   WBC 5.2 10/01/2017   HGB 14.1 10/01/2017   HCT 41.2 10/01/2017   MCV 97.9 10/01/2017   PLT 340 10/01/2017   Lab Results  Component Value Date   CREATININE 0.87 10/01/2017   BUN 7 10/01/2017   NA 139 10/01/2017   K 3.8 10/01/2017   CL 103 10/01/2017   CO2 22 10/01/2017    No results found for: HGBA1C     Component Value Date/Time   CHOL 201 (H) 10/23/2015 1109   TRIG 65 10/23/2015 1109   HDL 58 10/23/2015 1109   CHOLHDL 3.5 10/23/2015 1109   VLDL 13 10/23/2015 1109   LDLCALC 130 (H) 10/23/2015 1109        Assessment and plan:  1. Essential hypertension, elevated today. Stable. Given recent BP readings at [facility] and here today, will initiate antihypertensive therapy. Start amlodipine 10 mg once daily.  If blood pressure remains uncontrolled we will consider adding HCTZ if renal functioning is intact.  We have discussed target BP range and blood pressure goal. I have advised patient to check BP regularly and to call us back or report to clinic if the numbers are consistently higher than 140/90. We discussed the importance of compliance with medical therapy and DASH diet recommended, consequences of uncontrolled hypertension discussed.  - Comprehensive metabolic panel; Future - Thyroid Panel With TSH; Future -Gave in office -cloNIDine (CATAPRES) tablet 0.1 mg, she is unable to get medication until tomorrow.  2. Screening for deficiency anemia - CBC with Differential  3. Screening, lipid - Lipid panel  4. Screening for diabetes mellitus -Hemoglobin A1c  5. Complicated grief 6. Anxiety 7. Current episode of major depressive disorder without prior episode, unspecified depression episode severity Will initiate fluoxetine 20 mg once daily in efforts to energy and improve mood.  For anxiousness symptoms will start buspirone milligrams 3 times daily.  Return in about 6 weeks (around  12/22/2017) for Hypertension .   Meds ordered this encounter  Medications  . DISCONTD: amLODipine (NORVASC) 10 MG tablet    Sig: Take 1 tablet (10 mg total) by mouth daily.    Dispense:  90 tablet    Refill:  1  . DISCONTD: FLUoxetine (PROZAC) 20 MG tablet    Sig: Take 1 tablet (20 mg total) by mouth daily.    Dispense:  30 tablet    Refill:  1  . DISCONTD: busPIRone (BUSPAR) 10 MG tablet    Sig: Take 1 tablet (10 mg total) by mouth 3 (three) times daily.    Dispense:  90 tablet    Refill:  0  . FLUoxetine (PROZAC) 20 MG tablet    Sig: Take 1 tablet (20 mg total) by mouth daily.    Dispense:  30 tablet    Refill:  1  . busPIRone (BUSPAR) 10 MG tablet    Sig: Take 1 tablet (10 mg total) by mouth 3 (three) times daily.    Dispense:  90 tablet    Refill:  0  . amLODipine (NORVASC) 10 MG tablet    Sig: Take 1 tablet (10 mg total) by mouth daily.    Dispense:  90 tablet    Refill:  1  . cloNIDine (CATAPRES) tablet 0.1 mg    Orders Placed This Encounter  Procedures  . CBC with Differential    Standing Status:   Future    Number of Occurrences:   1    Standing Expiration Date:   11/11/2018  . Comprehensive metabolic panel    Standing Status:   Future    Number of Occurrences:   1    Standing Expiration Date:   11/11/2018    Order Specific Question:   Has the patient fasted?    Answer:   No  . Lipid panel    Standing Status:   Future    Number of Occurrences:   1    Standing Expiration Date:   11/11/2018    Order Specific Question:   Has the patient fasted?    Answer:   No  . Hemoglobin A1c    Standing Status:   Future    Number of Occurrences:   1    Standing Expiration Date:   11/11/2018  . Thyroid Panel With TSH    Standing Status:   Future    Number of Occurrences:   1    Standing Expiration Date:   11/11/2018    The patient was given clear instructions to go to ER or  return to medical center if symptoms don't improve, worsen or new problems develop. The patient  verbalized understanding. The patient was advised  to call and obtain lab results if they haven't heard anything from out office within 7-10 business days.  A total of 45  minutes spent, greater than 50 % of this time was spent counseling and coordination of care.  Joaquin Courts, FNP Primary Care at [facility] [address], [place] [address] [phone]: [phone]    This note has been created with Dragon speech recognition software and Paediatric nurse. Any transcriptional errors are unintentional.

## 2017-11-11 ENCOUNTER — Ambulatory Visit: Payer: Self-pay | Attending: Family Medicine

## 2017-11-11 DIAGNOSIS — Z13220 Encounter for screening for lipoid disorders: Secondary | ICD-10-CM

## 2017-11-11 DIAGNOSIS — Z130 Encounter for screening for diseases of the blood and blood-forming organs and certain disorders involving the immune mechanism: Secondary | ICD-10-CM

## 2017-11-11 DIAGNOSIS — I10 Essential (primary) hypertension: Secondary | ICD-10-CM | POA: Insufficient documentation

## 2017-11-11 DIAGNOSIS — Z131 Encounter for screening for diabetes mellitus: Secondary | ICD-10-CM

## 2017-11-12 DIAGNOSIS — F419 Anxiety disorder, unspecified: Secondary | ICD-10-CM | POA: Insufficient documentation

## 2017-11-12 DIAGNOSIS — I10 Essential (primary) hypertension: Secondary | ICD-10-CM | POA: Insufficient documentation

## 2017-11-12 DIAGNOSIS — F329 Major depressive disorder, single episode, unspecified: Secondary | ICD-10-CM | POA: Insufficient documentation

## 2017-11-12 LAB — THYROID PANEL WITH TSH
FREE THYROXINE INDEX: 1.9 (ref 1.2–4.9)
T3 UPTAKE RATIO: 31 % (ref 24–39)
T4, Total: 6.1 ug/dL (ref 4.5–12.0)
TSH: 1.52 u[IU]/mL (ref 0.450–4.500)

## 2017-11-12 LAB — CBC WITH DIFFERENTIAL/PLATELET
BASOS: 1 %
Basophils Absolute: 0 10*3/uL (ref 0.0–0.2)
EOS (ABSOLUTE): 0.1 10*3/uL (ref 0.0–0.4)
Eos: 2 %
HEMOGLOBIN: 13.2 g/dL (ref 11.1–15.9)
Hematocrit: 39.5 % (ref 34.0–46.6)
IMMATURE GRANS (ABS): 0 10*3/uL (ref 0.0–0.1)
Immature Granulocytes: 0 %
Lymphocytes Absolute: 2.5 10*3/uL (ref 0.7–3.1)
Lymphs: 40 %
MCH: 32.4 pg (ref 26.6–33.0)
MCHC: 33.4 g/dL (ref 31.5–35.7)
MCV: 97 fL (ref 79–97)
MONOS ABS: 0.6 10*3/uL (ref 0.1–0.9)
Monocytes: 10 %
NEUTROS PCT: 47 %
Neutrophils Absolute: 2.8 10*3/uL (ref 1.4–7.0)
Platelets: 322 10*3/uL (ref 150–450)
RBC: 4.07 x10E6/uL (ref 3.77–5.28)
RDW: 13.5 % (ref 12.3–15.4)
WBC: 6.1 10*3/uL (ref 3.4–10.8)

## 2017-11-12 LAB — COMPREHENSIVE METABOLIC PANEL
A/G RATIO: 1.4 (ref 1.2–2.2)
ALK PHOS: 81 IU/L (ref 39–117)
ALT: 10 IU/L (ref 0–32)
AST: 14 IU/L (ref 0–40)
Albumin: 4.6 g/dL (ref 3.6–4.8)
BUN/Creatinine Ratio: 18 (ref 12–28)
BUN: 13 mg/dL (ref 8–27)
Bilirubin Total: 0.4 mg/dL (ref 0.0–1.2)
CALCIUM: 8.7 mg/dL (ref 8.7–10.3)
CO2: 17 mmol/L — ABNORMAL LOW (ref 20–29)
Chloride: 106 mmol/L (ref 96–106)
Creatinine, Ser: 0.71 mg/dL (ref 0.57–1.00)
GFR calc Af Amer: 107 mL/min/{1.73_m2} (ref >59)
GFR, EST NON AFRICAN AMERICAN: 93 mL/min/{1.73_m2} (ref >59)
GLOBULIN, TOTAL: 3.2 g/dL (ref 1.5–4.5)
Glucose: 78 mg/dL (ref 65–99)
POTASSIUM: 4.4 mmol/L (ref 3.5–5.2)
SODIUM: 145 mmol/L — AB (ref 134–144)
Total Protein: 7.8 g/dL (ref 6.0–8.5)

## 2017-11-12 LAB — LIPID PANEL
CHOL/HDL RATIO: 2.5 ratio (ref 0.0–4.4)
CHOLESTEROL TOTAL: 220 mg/dL — AB (ref 100–199)
HDL: 89 mg/dL (ref >39)
LDL Calculated: 111 mg/dL — ABNORMAL HIGH (ref 0–99)
TRIGLYCERIDES: 98 mg/dL (ref 0–149)
VLDL Cholesterol Cal: 20 mg/dL (ref 5–40)

## 2017-11-12 LAB — HEMOGLOBIN A1C
Est. average glucose Bld gHb Est-mCnc: 114 mg/dL
HEMOGLOBIN A1C: 5.6 % (ref 4.8–5.6)

## 2017-11-12 NOTE — Progress Notes (Signed)
Patient notified of results & recommendations. Expressed understanding.

## 2017-12-05 MED FILL — AMLODIPINE BESYLATE 10 MG T: 10 | 30 days supply | Qty: 30 | Fill #1

## 2017-12-22 ENCOUNTER — Ambulatory Visit: Payer: Self-pay | Admitting: Family Medicine

## 2017-12-24 ENCOUNTER — Ambulatory Visit (INDEPENDENT_AMBULATORY_CARE_PROVIDER_SITE_OTHER): Payer: Self-pay | Admitting: Family Medicine

## 2017-12-24 ENCOUNTER — Encounter: Payer: Self-pay | Admitting: Family Medicine

## 2017-12-24 VITALS — BP 122/79 | HR 68 | Resp 17 | Ht 63.0 in | Wt 154.6 lb

## 2017-12-24 DIAGNOSIS — F419 Anxiety disorder, unspecified: Secondary | ICD-10-CM

## 2017-12-24 DIAGNOSIS — F329 Major depressive disorder, single episode, unspecified: Secondary | ICD-10-CM

## 2017-12-24 DIAGNOSIS — I10 Essential (primary) hypertension: Secondary | ICD-10-CM

## 2017-12-24 DIAGNOSIS — K047 Periapical abscess without sinus: Secondary | ICD-10-CM

## 2017-12-24 MED ORDER — AMOXICILLIN 875 MG PO TABS: 875.0000 mg | ORAL_TABLET | Freq: Two times a day (BID) | ORAL | 0 refills | Status: DC

## 2017-12-24 MED FILL — AMOXICILLIN 875 MG TABS: 875 | 10 days supply | Qty: 20 | Fill #0

## 2017-12-24 NOTE — Progress Notes (Signed)
Established Patient Office Visit  Subjective:  Patient ID: Rhonda Best, female    DOB: 28-Oct-1955  Age: 62 y.o. MRN: [id number]  CC:  Chief Complaint  Patient presents with  . Hypertension    HPI  Rhonda Best presents for   Hypertension Patient was recently resumed on her blood pressure medication.  Initially presented to establish care back in October and her blood pressure had markedly been elevated at that time.  Was compliance with blood pressure medication and making efforts to dietary choices.  She denies any headache, chest pain, shortness of breath, or swelling.  Not monitoring blood pressure at home.  Anxiety and Depression  Patient was recently started on Prozac 20 mg once daily and BuSpar 10 mg 3 times daily for major depressive disorder along with anxiety.  Patient recently experienced the death of a very close friend and complicated grief along with depressed mood, increased worrying, and tearful episodes.  Ports today taking medication as prescribed and symptoms have significantly improved.  She is no longer having crying spells.  Occasionally has some feelings of depression however denies any suicidal ideations, homicidal ideations, sleep disturbances, or auditory hallucinations.  Dental problem Right lower gums pain and swelling. She noticed an abscess along the lower lateral right gum line. She denies bleeding of gums. She reports several years since evaluated by a dentist.  He denies fever, chills, nausea, vomiting.  Past Medical History:  Diagnosis Date  . Hypertension     No past surgical history on file.  Family History  Problem Relation Age of Onset  . Healthy Mother     Social History   Socioeconomic History  . Marital status: Single    Spouse name: Not on file  . Number of children: Not on file  . Years of education: Not on file  . Highest education level: Not on file  Occupational History  . Not on file  Social Needs  . Financial  resource strain: Not on file  . Food insecurity:    Worry: Not on file    Inability: Not on file  . Transportation needs:    Medical: Not on file    Non-medical: Not on file  Tobacco Use  . Smoking status: Current Every Day Smoker    Packs/day: 0.05    Types: Cigarettes  . Smokeless tobacco: Never Used  Substance and Sexual Activity  . Alcohol use: Yes    Comment: Socially   . Drug use: No  . Sexual activity: Not on file  Lifestyle  . Physical activity:    Days per week: Not on file    Minutes per session: Not on file  . Stress: Not on file  Relationships  . Social connections:    Talks on phone: Not on file    Gets together: Not on file    Attends religious service: Not on file    Active member of club or organization: Not on file    Attends meetings of clubs or organizations: Not on file    Relationship status: Not on file  . Intimate partner violence:    Fear of current or ex partner: Not on file    Emotionally abused: Not on file    Physically abused: Not on file    Forced sexual activity: Not on file  Other Topics Concern  . Not on file  Social History Narrative  . Not on file    Outpatient Medications Prior to Visit  Medication Sig Dispense Refill  .  amLODipine (NORVASC) 10 MG tablet Take 1 tablet (10 mg total) by mouth daily. 90 tablet 1  . busPIRone (BUSPAR) 10 MG tablet Take 1 tablet (10 mg total) by mouth 3 (three) times daily. 90 tablet 0  . FLUoxetine (PROZAC) 20 MG tablet Take 1 tablet (20 mg total) by mouth daily. 30 tablet 1   No facility-administered medications prior to visit.     No Known Allergies  ROS Review of Systems Pertinent negatives listed in HPI   Objective:    Physical Exam General appearance: alert, well developed, well nourished, cooperative and in no distress Head: Normocephalic, without obvious abnormality, atraumatic Mouth: Severe dental caries noted throughout oral cavity.  Inflamed gums along the lower right lateral line.   No visible abscess seen although patient is very tender with palpation. Respiratory: Respirations even and unlabored, normal respiratory rate Cardiovascular: Normal rate and rhythm Extremities: No gross deformities Skin: Skin color, texture, turgor normal. No rashes seen  Psych: Appropriate mood and affect. Neurologic: Mental status: Alert, oriented to person, place, and time, thought content appropriate.  BP 122/79   Pulse 68   Resp 17   Ht 5\' 3"  (1.6 m)   Wt 154 lb 9.6 oz (70.1 kg)   SpO2 94%   BMI 27.39 kg/m  Wt Readings from Last 3 Encounters:  12/24/17 154 lb 9.6 oz (70.1 kg)  11/10/17 161 lb 3.2 oz (73.1 kg)  10/01/17 140 lb (63.5 kg)     Health Maintenance Due  Topic Date Due  . Hepatitis C Screening  [birth year]  . TETANUS/TDAP  11/16/1975  . PAP SMEAR  11/15/1977  . MAMMOGRAM  11/16/2006  . COLONOSCOPY  11/16/2006    There are no preventive care reminders to display for this patient.  Lab Results  Component Value Date   TSH 1.520 11/11/2017   Lab Results  Component Value Date   WBC 6.1 11/11/2017   HGB 13.2 11/11/2017   HCT 39.5 11/11/2017   MCV 97 11/11/2017   PLT 322 11/11/2017   Lab Results  Component Value Date   NA 145 (H) 11/11/2017   K 4.4 11/11/2017   CO2 17 (L) 11/11/2017   GLUCOSE 78 11/11/2017   BUN 13 11/11/2017   CREATININE 0.71 11/11/2017   BILITOT 0.4 11/11/2017   ALKPHOS 81 11/11/2017   AST 14 11/11/2017   ALT 10 11/11/2017   PROT 7.8 11/11/2017   ALBUMIN 4.6 11/11/2017   CALCIUM 8.7 11/11/2017   ANIONGAP 14 10/01/2017   Lab Results  Component Value Date   CHOL 220 (H) 11/11/2017   Lab Results  Component Value Date   HDL 89 11/11/2017   Lab Results  Component Value Date   LDLCALC 111 (H) 11/11/2017   Lab Results  Component Value Date   TRIG 98 11/11/2017   Lab Results  Component Value Date   CHOLHDL 2.5 11/11/2017   Lab Results  Component Value Date   HGBA1C 5.6 11/11/2017      Assessment & Plan:    Problem List Items Addressed This Visit      Cardiovascular and Mediastinum   Essential hypertension - Primary, well controlled today. Continue current medications no changes.  Educated on the importance of continued to reduce sodium and make an appropriate dietary choices in order to maintain normotensive blood pressure readings.     Other   Current episode of major depressive disorder without prior episode   Anxiety -Symptoms currently controlled with Prozac and BuSpar.  No changes today.  Other Visit Diagnoses    Dental abscess     -Start amoxicillin 875 twice daily x10 days.  Advised patient to reapply for orange card as her teeth need immediate evaluation at the dental clinic.        Meds ordered this encounter  Medications  . amoxicillin (AMOXIL) 875 MG tablet    Sig: Take 1 tablet (875 mg total) by mouth 2 (two) times daily.    Dispense:  20 tablet    Refill:  0    Follow-up: Return in about 3 months (around 03/25/2018).    Joaquin Courts, FNP Primary Care at [facility] [address] [place] [address] [phone]: [phone]

## 2017-12-24 NOTE — Progress Notes (Deleted)
Subjective:  Rhonda Best is a 62 y.o. female with hypertension. Current Outpatient Medications  Medication Sig Dispense Refill  . amLODipine (NORVASC) 10 MG tablet Take 1 tablet (10 mg total) by mouth daily. 90 tablet 1  . busPIRone (BUSPAR) 10 MG tablet Take 1 tablet (10 mg total) by mouth 3 (three) times daily. 90 tablet 0  . FLUoxetine (PROZAC) 20 MG tablet Take 1 tablet (20 mg total) by mouth daily. 30 tablet 1   No current facility-administered medications for this visit.     Hypertension ROS: {htn cvs ros:315727::"taking medications as instructed","no medication side effects noted","no TIA's","no chest pain on exertion","no dyspnea on exertion","no swelling of ankles"}.  New concerns: ***.   Objective:  BP 122/79   Pulse 68   Resp 17   Ht 5\' 3"  (1.6 m)   Wt 154 lb 9.6 oz (70.1 kg)   SpO2 94%   BMI 27.39 kg/m   Appearance {appearance:315021::"alert, well appearing, and in no distress"}. General exam {htn exam:315726::"BP noted to be well controlled today in office","S1, S2 normal, no gallop, no murmur, chest clear, no JVD, no HSM, no edema"}.  Lab review: {lab reviewed:315731}.   Assessment:   Hypertension {disease control degree:315147}.   Plan:  {disease follow up plans:315730}.

## 2017-12-24 NOTE — Patient Instructions (Signed)
Dental Abscess A dental abscess is pus in or around a tooth. Follow these instructions at home:  Take medicines only as told by your dentist.  If you were prescribed antibiotic medicine, finish all of it even if you start to feel better.  Rinse your mouth (gargle) often with salt water.  Do not drive or use heavy machinery, like a lawn mower, while taking pain medicine.  Do not apply heat to the outside of your mouth.  Keep all follow-up visits as told by your dentist. This is important. Contact a doctor if:  Your pain is worse, and medicine does not help. Get help right away if:  You have a fever or chills.  Your symptoms suddenly get worse.  You have a very bad headache.  You have problems breathing or swallowing.  You have trouble opening your mouth.  You have puffiness (swelling) in your neck or around your eye. This information is not intended to replace advice given to you by your health care provider. Make sure you discuss any questions you have with your health care provider. Document Released: 05/24/2014 Document Revised: 06/15/2015 Document Reviewed: 01/04/2014 Elsevier Interactive Patient Education  2018 Elsevier Inc.    DASH Eating Plan DASH stands for "Dietary Approaches to Stop Hypertension." The DASH eating plan is a healthy eating plan that has been shown to reduce high blood pressure (hypertension). It may also reduce your risk for type 2 diabetes, heart disease, and stroke. The DASH eating plan may also help with weight loss. What are tips for following this plan? General guidelines  Avoid eating more than 2,300 mg (milligrams) of salt (sodium) a day. If you have hypertension, you may need to reduce your sodium intake to 1,500 mg a day.  Limit alcohol intake to no more than 1 drink a day for nonpregnant women and 2 drinks a day for men. One drink equals 12 oz of beer, 5 oz of wine, or 1 oz of hard liquor.  Work with your health care provider to  maintain a healthy body weight or to lose weight. Ask what an ideal weight is for you.  Get at least 30 minutes of exercise that causes your heart to beat faster (aerobic exercise) most days of the week. Activities may include walking, swimming, or biking.  Work with your health care provider or diet and nutrition specialist (dietitian) to adjust your eating plan to your individual calorie needs. Reading food labels  Check food labels for the amount of sodium per serving. Choose foods with less than 5 percent of the Daily Value of sodium. Generally, foods with less than 300 mg of sodium per serving fit into this eating plan.  To find whole grains, look for the word "whole" as the first word in the ingredient list. Shopping  Buy products labeled as "low-sodium" or "no salt added."  Buy fresh foods. Avoid canned foods and premade or frozen meals. Cooking  Avoid adding salt when cooking. Use salt-free seasonings or herbs instead of table salt or sea salt. Check with your health care provider or pharmacist before using salt substitutes.  Do not fry foods. Cook foods using healthy methods such as baking, boiling, grilling, and broiling instead.  Cook with heart-healthy oils, such as olive, canola, soybean, or sunflower oil. Meal planning   Eat a balanced diet that includes: ? 5 or more servings of fruits and vegetables each day. At each meal, try to fill half of your plate with fruits and vegetables. ? Up  to 6-8 servings of whole grains each day. ? Less than 6 oz of lean meat, poultry, or fish each day. A 3-oz serving of meat is about the same size as a deck of cards. One egg equals 1 oz. ? 2 servings of low-fat dairy each day. ? A serving of nuts, seeds, or beans 5 times each week. ? Heart-healthy fats. Healthy fats called Omega-3 fatty acids are found in foods such as flaxseeds and coldwater fish, like sardines, salmon, and mackerel.  Limit how much you eat of the following: ? Canned  or prepackaged foods. ? Food that is high in trans fat, such as fried foods. ? Food that is high in saturated fat, such as fatty meat. ? Sweets, desserts, sugary drinks, and other foods with added sugar. ? Full-fat dairy products.  Do not salt foods before eating.  Try to eat at least 2 vegetarian meals each week.  Eat more home-cooked food and less restaurant, buffet, and fast food.  When eating at a restaurant, ask that your food be prepared with less salt or no salt, if possible. What foods are recommended? The items listed may not be a complete list. Talk with your dietitian about what dietary choices are best for you. Grains Whole-grain or whole-wheat bread. Whole-grain or whole-wheat pasta. Brown rice. Orpah Cobb. Bulgur. Whole-grain and low-sodium cereals. Pita bread. Low-fat, low-sodium crackers. Whole-wheat flour tortillas. Vegetables Fresh or frozen vegetables (raw, steamed, roasted, or grilled). Low-sodium or reduced-sodium tomato and vegetable juice. Low-sodium or reduced-sodium tomato sauce and tomato paste. Low-sodium or reduced-sodium canned vegetables. Fruits All fresh, dried, or frozen fruit. Canned fruit in natural juice (without added sugar). Meat and other protein foods Skinless chicken or [place]. Ground chicken or [place]. Pork with fat trimmed off. Fish and seafood. Egg whites. Dried beans, peas, or lentils. Unsalted nuts, nut butters, and seeds. Unsalted canned beans. Lean cuts of beef with fat trimmed off. Low-sodium, lean deli meat. Dairy Low-fat (1%) or fat-free (skim) milk. Fat-free, low-fat, or reduced-fat cheeses. Nonfat, low-sodium ricotta or cottage cheese. Low-fat or nonfat yogurt. Low-fat, low-sodium cheese. Fats and oils Soft margarine without trans fats. Vegetable oil. Low-fat, reduced-fat, or light mayonnaise and salad dressings (reduced-sodium). Canola, safflower, olive, soybean, and sunflower oils. Avocado. Seasoning and other foods Herbs. Spices.  Seasoning mixes without salt. Unsalted popcorn and pretzels. Fat-free sweets. What foods are not recommended? The items listed may not be a complete list. Talk with your dietitian about what dietary choices are best for you. Grains Baked goods made with fat, such as croissants, muffins, or some breads. Dry pasta or rice meal packs. Vegetables Creamed or fried vegetables. Vegetables in a cheese sauce. Regular canned vegetables (not low-sodium or reduced-sodium). Regular canned tomato sauce and paste (not low-sodium or reduced-sodium). Regular tomato and vegetable juice (not low-sodium or reduced-sodium). Rosita Fire. Olives. Fruits Canned fruit in a light or heavy syrup. Fried fruit. Fruit in cream or butter sauce. Meat and other protein foods Fatty cuts of meat. Ribs. Fried meat. Tomasa Blase. Sausage. Bologna and other processed lunch meats. Salami. Fatback. Hotdogs. Bratwurst. Salted nuts and seeds. Canned beans with added salt. Canned or smoked fish. Whole eggs or egg yolks. Chicken or [place] with skin. Dairy Whole or 2% milk, cream, and half-and-half. Whole or full-fat cream cheese. Whole-fat or sweetened yogurt. Full-fat cheese. Nondairy creamers. Whipped toppings. Processed cheese and cheese spreads. Fats and oils Butter. Stick margarine. Lard. Shortening. Ghee. Bacon fat. Tropical oils, such as coconut, palm kernel, or palm oil. Seasoning  and other foods Salted popcorn and pretzels. Onion salt, garlic salt, seasoned salt, table salt, and sea salt. Worcestershire sauce. Tartar sauce. Barbecue sauce. Teriyaki sauce. Soy sauce, including reduced-sodium. Steak sauce. Canned and packaged gravies. Fish sauce. Oyster sauce. Cocktail sauce. Horseradish that you find on the shelf. Ketchup. Mustard. Meat flavorings and tenderizers. Bouillon cubes. Hot sauce and Tabasco sauce. Premade or packaged marinades. Premade or packaged taco seasonings. Relishes. Regular salad dressings. Where to find more  information:  National Heart, Lung, and [place]: https://wilson-eaton.com/  American Heart Association: www.heart.org Summary  The DASH eating plan is a healthy eating plan that has been shown to reduce high blood pressure (hypertension). It may also reduce your risk for type 2 diabetes, heart disease, and stroke.  With the DASH eating plan, you should limit salt (sodium) intake to 2,300 mg a day. If you have hypertension, you may need to reduce your sodium intake to 1,500 mg a day.  When on the DASH eating plan, aim to eat more fresh fruits and vegetables, whole grains, lean proteins, low-fat dairy, and heart-healthy fats.  Work with your health care provider or diet and nutrition specialist (dietitian) to adjust your eating plan to your individual calorie needs. This information is not intended to replace advice given to you by your health care provider. Make sure you discuss any questions you have with your health care provider. Document Released: 12/27/2010 Document Revised: 01/01/2016 Document Reviewed: 01/01/2016 Elsevier Interactive Patient Education  [employer].

## 2018-02-17 MED FILL — AMLODIPINE BESYLATE 10 MG T: 10 | 30 days supply | Qty: 30 | Fill #2

## 2018-03-25 ENCOUNTER — Ambulatory Visit: Payer: Self-pay | Admitting: Family Medicine

## 2018-03-25 MED FILL — AMLODIPINE BESYLATE 10 MG T: 10 | 30 days supply | Qty: 30 | Fill #2

## 2018-03-30 ENCOUNTER — Ambulatory Visit: Payer: Self-pay | Admitting: Family Medicine

## 2018-07-21 MED FILL — ?AMLODIPINE BESYLATE 10 MG: 10 | 30 days supply | Qty: 30 | Fill #3

## 2018-07-23 ENCOUNTER — Telehealth: Payer: Self-pay | Admitting: Family Medicine

## 2018-07-23 ENCOUNTER — Encounter (HOSPITAL_COMMUNITY): Payer: Self-pay | Admitting: *Deleted

## 2018-07-23 ENCOUNTER — Emergency Department (HOSPITAL_COMMUNITY): Payer: Self-pay

## 2018-07-23 ENCOUNTER — Other Ambulatory Visit: Payer: Self-pay

## 2018-07-23 ENCOUNTER — Emergency Department (HOSPITAL_COMMUNITY)
Admission: EM | Admit: 2018-07-23 | Discharge: 2018-07-24 | Disposition: A | Discharge status: Home / Self Care | Payer: Self-pay | Attending: Emergency Medicine | Admitting: Emergency Medicine

## 2018-07-23 DIAGNOSIS — F17210 Nicotine dependence, cigarettes, uncomplicated: Secondary | ICD-10-CM | POA: Insufficient documentation

## 2018-07-23 DIAGNOSIS — I10 Essential (primary) hypertension: Secondary | ICD-10-CM | POA: Insufficient documentation

## 2018-07-23 DIAGNOSIS — R079 Chest pain, unspecified: Secondary | ICD-10-CM | POA: Insufficient documentation

## 2018-07-23 DIAGNOSIS — J40 Bronchitis, not specified as acute or chronic: Secondary | ICD-10-CM | POA: Insufficient documentation

## 2018-07-23 MED ORDER — SODIUM CHLORIDE 0.9% FLUSH: 3.0000 mL | Freq: Once | INTRAVENOUS | Status: DC

## 2018-07-23 NOTE — ED Triage Notes (Signed)
Pt reports onset of shortness of breath and chest tightness since Friday. Denies fevers.

## 2018-07-23 NOTE — Telephone Encounter (Signed)
Patient needs to go to the ER. She will need to wear a face covering. Given her age she is high risk for complications associated with COVID. I'm unable to provide her with a work note as I have evaluated her via a visit.

## 2018-07-23 NOTE — Telephone Encounter (Signed)
Patient was sent home 14 days because of the Covid and she is saying she is sob and doesn't feel well she stated she was tested Monday at [facility]. And needs a note for work

## 2018-07-23 NOTE — Telephone Encounter (Signed)
Patient called & notified of this information. Expressed understanding

## 2018-07-24 LAB — CBC
HCT: 36.8 % (ref 36.0–46.0)
Hemoglobin: 12.4 g/dL (ref 12.0–15.0)
MCH: 34.3 pg — ABNORMAL HIGH (ref 26.0–34.0)
MCHC: 33.7 g/dL (ref 30.0–36.0)
MCV: 101.9 fL — ABNORMAL HIGH (ref 80.0–100.0)
Platelets: 229 10*3/uL (ref 150–400)
RBC: 3.61 MIL/uL — ABNORMAL LOW (ref 3.87–5.11)
RDW: 14.6 % (ref 11.5–15.5)
WBC: 7.1 10*3/uL (ref 4.0–10.5)
nRBC: 0 % (ref 0.0–0.2)

## 2018-07-24 LAB — TROPONIN I (HIGH SENSITIVITY)
Troponin I (High Sensitivity): 11 ng/L (ref <18)
Troponin I (High Sensitivity): 3 ng/L (ref <18)

## 2018-07-24 LAB — BASIC METABOLIC PANEL
Anion gap: 14 (ref 5–15)
BUN: 7 mg/dL — ABNORMAL LOW (ref 8–23)
CO2: 18 mmol/L — ABNORMAL LOW (ref 22–32)
Calcium: 9.5 mg/dL (ref 8.9–10.3)
Chloride: 105 mmol/L (ref 98–111)
Creatinine, Ser: 0.62 mg/dL (ref 0.44–1.00)
GFR calc Af Amer: >60 mL/min (ref >60)
GFR calc non Af Amer: >60 mL/min (ref >60)
Glucose, Bld: 84 mg/dL (ref 70–99)
Potassium: 3.5 mmol/L (ref 3.5–5.1)
Sodium: 137 mmol/L (ref 135–145)

## 2018-07-24 MED ORDER — PREDNISONE 20 MG PO TABS: ORAL_TABLET | ORAL | 0 refills | Status: DC

## 2018-07-24 MED ORDER — ALBUTEROL SULFATE HFA 108 (90 BASE) MCG/ACT IN AERS
2.0000 | INHALATION_SPRAY | Freq: Once | RESPIRATORY_TRACT | Status: AC
  Administered 2018-07-24: 02:00:00 2 via RESPIRATORY_TRACT
  Filled 2018-07-24: qty 6.7

## 2018-07-24 NOTE — ED Provider Notes (Signed)
Emergency Department Provider Note   I have reviewed the triage vital signs and the nursing notes.   HISTORY  Chief Complaint Chest Pain   HPI Rhonda Best is a 63 y.o. female who presents the emergency department today with chest pain.  Patient states she is had off and on for the last couple days.  Is not related anything particular but she does seem to have anxiety whenever she has it.  She had one episode of vomiting prior to coming in but not really associate with chest pain.  She is elevated shortness of breath and cough but no fever or productive cough.  No lower extremity swelling.  She states she is had this similar episode multiple times in the past and causes never been found.  No recent travel/surgeries/illnesses. No trauma.  No other associated symptoms.   No other associated or modifying symptoms.    Past Medical History:  Diagnosis Date  . Hypertension     Patient Active Problem List   Diagnosis Date Noted  . Essential hypertension 11/12/2017  . Current episode of major depressive disorder without prior episode 11/12/2017  . Anxiety 11/12/2017    History reviewed. No pertinent surgical history.  Current Outpatient Rx  . Order #: [id number] Class: Normal  . Order #: [id number] Class: Normal  . Order #: [id number] Class: Normal  . Order #: [id number] Class: Print    Allergies Patient has no known allergies.  Family History  Problem Relation Age of Onset  . Healthy Mother     Social History Social History   Tobacco Use  . Smoking status: Current Every Day Smoker    Packs/day: 0.05    Types: Cigarettes  . Smokeless tobacco: Never Used  Substance Use Topics  . Alcohol use: Yes    Comment: Socially   . Drug use: No    Review of Systems  All other systems negative except as documented in the HPI. All pertinent positives and negatives as reviewed in the HPI. ____________________________________________   PHYSICAL EXAM:  VITAL SIGNS: ED  Triage Vitals  Enc Vitals Group     BP 07/23/18 2254 (!) 162/90     Pulse Rate 07/23/18 2254 83     Resp 07/23/18 2254 18     Temp 07/23/18 2254 98 F (36.7 C)     Temp Source 07/23/18 2254 Oral     SpO2 07/23/18 2254 100 %    Constitutional: Alert and oriented. Well appearing and in no acute distress. Eyes: Conjunctivae are normal. PERRL. EOMI. Head: Atraumatic. Nose: No congestion/rhinnorhea. Mouth/Throat: Mucous membranes are moist.  Oropharynx non-erythematous. Neck: No stridor.  No meningeal signs.   Cardiovascular: Normal rate, regular rhythm. Good peripheral circulation. Grossly normal heart sounds.   Respiratory: Normal respiratory effort.  No retractions. Lungs diminished diffusely. Gastrointestinal: Soft and nontender. No distention.  Musculoskeletal: No lower extremity tenderness nor edema. No gross deformities of extremities. Neurologic:  Normal speech and language. No gross focal neurologic deficits are appreciated.  Skin:  Skin is warm, dry and intact. No rash noted.  ____________________________________________   LABS (all labs ordered are listed, but only abnormal results are displayed)  Labs Reviewed  BASIC METABOLIC PANEL - Abnormal; Notable for the following components:      Result Value   CO2 18 (*)    BUN 7 (*)    All other components within normal limits  CBC - Abnormal; Notable for the following components:   RBC 3.61 (*)    MCV 101.9 (*)  MCH 34.3 (*)    All other components within normal limits  TROPONIN I (HIGH SENSITIVITY)  TROPONIN I (HIGH SENSITIVITY)   ____________________________________________  EKG   EKG Interpretation  Date/Time:  Thursday July 23 2018 22:54:28 EDT Ventricular Rate:  76 PR Interval:  156 QRS Duration: 76 QT Interval:  374 QTC Calculation: 420 R Axis:   62 Text Interpretation:  Normal sinus rhythm Normal ECG No significant change since last tracing Confirmed by Merrily Pew [phone]) on 07/24/2018 12:57:10 AM       ____________________________________________  RADIOLOGY  Dg Chest 2 View  Result Date: 07/23/2018 CLINICAL DATA:  Shortness of breath, chest pain EXAM: CHEST - 2 VIEW COMPARISON:  10/01/2017 FINDINGS: Heart and mediastinal contours are within normal limits. No focal opacities or effusions. No acute bony abnormality. IMPRESSION: No active cardiopulmonary disease. Electronically Signed   By: Rolm Baptise M.D.   On: 07/23/2018 23:39    ____________________________________________  INITIAL IMPRESSION / ASSESSMENT AND PLAN / ED COURSE  Very atypical story for ACS admits serial troponins that are negative and a normal EKG I doubt that the cause is cardiac related.  Patient has no risk factors for PE. No infectious symptoms. Does have diminished breath sounds improved significantly with albuterol.   Pertinent labs & imaging results that were available during my care of the patient were reviewed by me and considered in my medical decision making (see chart for details).  A medical screening exam was performed and I feel the patient has had an appropriate workup for their chief complaint at this time and likelihood of emergent condition existing is low. They have been counseled on decision, discharge, follow up and which symptoms necessitate immediate return to the emergency department. They or their family verbally stated understanding and agreement with plan and discharged in stable condition.   ____________________________________________  FINAL CLINICAL IMPRESSION(S) / ED DIAGNOSES  Final diagnoses:  Nonspecific chest pain  Bronchitis    MEDICATIONS GIVEN DURING THIS VISIT:  Medications  sodium chloride flush (NS) 0.9 % injection 3 mL (has no administration in time range)  albuterol (VENTOLIN HFA) 108 (90 Base) MCG/ACT inhaler 2 puff (2 puffs Inhalation Given 07/24/18 0221)     NEW OUTPATIENT MEDICATIONS STARTED DURING THIS VISIT:  New Prescriptions   PREDNISONE (DELTASONE) 20 MG  TABLET    3 tabs po day one, then 2 po daily x 4 days    Note:  This note was prepared with assistance of Dragon voice recognition software. Occasional wrong-word or sound-a-like substitutions may have occurred due to the inherent limitations of voice recognition software.   Eira Alpert, Corene Cornea, MD 07/24/18 [phone]

## 2018-07-28 MED FILL — predniSONE 20 MG TABS: 20 | 5 days supply | Qty: 11 | Fill #0

## 2018-07-30 ENCOUNTER — Ambulatory Visit (INDEPENDENT_AMBULATORY_CARE_PROVIDER_SITE_OTHER): Payer: Self-pay | Admitting: Family Medicine

## 2018-07-30 ENCOUNTER — Telehealth: Payer: Self-pay | Admitting: Family Medicine

## 2018-07-30 ENCOUNTER — Other Ambulatory Visit: Payer: Self-pay

## 2018-07-30 DIAGNOSIS — R05 Cough: Secondary | ICD-10-CM

## 2018-07-30 DIAGNOSIS — Z20822 Contact with and (suspected) exposure to covid-19: Secondary | ICD-10-CM

## 2018-07-30 DIAGNOSIS — R0789 Other chest pain: Secondary | ICD-10-CM

## 2018-07-30 DIAGNOSIS — Z20828 Contact with and (suspected) exposure to other viral communicable diseases: Secondary | ICD-10-CM

## 2018-07-30 DIAGNOSIS — R0602 Shortness of breath: Secondary | ICD-10-CM

## 2018-07-30 NOTE — Progress Notes (Signed)
Virtual Visit via Telephone Note  I connected with Rhonda Best on 07/30/18 at  1:30 PM EDT by telephone and verified that I am speaking with the correct person using two identifiers.  Location: Patient: Located at home during today's encounter  Provider: Located at primary care office     I discussed the limitations, risks, security and privacy concerns of performing an evaluation and management service by telephone and the availability of in person appointments. I also discussed with the patient that there may be a patient responsible charge related to this service. The patient expressed understanding and agreed to proceed.   History of Present Illness: Rhonda Best is present via today's encounter for a ER follow-up. Patient presented to the ER on 07/23/18 with symptoms of shortness of breath and chest tightness. Chest x-ray was unremarkable. She did present to a community testing at [facility] and was COVID tested on 07/27/18 , however, her results are still pending. She is actively experiencing cough and shortness of breath. She is afebrile. Denies GI symptoms or chest tightness. She has remained out of work since illness began. She is using prescribed albuterol inhaler with relief. She is requesting a work note as she is due to return to work today.  Assessment and Plan: 1. Suspected Covid-19 Virus Infection Follow-up with our office once you COVID-19 test results. Continue to self-quarantine. Stop smoking as this will worsen work of breathing. Encouraged hydration with water. Encouraged deep breathing exercises. Avoid social interaction as you are still symptomatic.  Patient advised that he should consider himself infected until he is symptom free for 72 hours. Advised to go immediately to the ER if symptoms worsen or do not improve.   Follow Up Instructions: Follow-up in one week for RTW evaluation.   I discussed the assessment and treatment plan with the patient. The  patient was provided an opportunity to ask questions and all were answered. The patient agreed with the plan and demonstrated an understanding of the instructions.   The patient was advised to call back or seek an in-person evaluation if the symptoms worsen or if the condition fails to improve as anticipated.  I provided 20 minutes of non-face-to-face time during this encounter.   Molli Barrows, FNP

## 2018-07-30 NOTE — Telephone Encounter (Signed)
Work note faxed to [phone], attention Marguerita Beards

## 2018-08-05 ENCOUNTER — Other Ambulatory Visit: Payer: Self-pay

## 2018-08-05 ENCOUNTER — Encounter (HOSPITAL_COMMUNITY): Payer: Self-pay

## 2018-08-05 ENCOUNTER — Emergency Department (HOSPITAL_COMMUNITY): Payer: Self-pay

## 2018-08-05 ENCOUNTER — Emergency Department (HOSPITAL_COMMUNITY)
Admission: EM | Admit: 2018-08-05 | Discharge: 2018-08-05 | Disposition: A | Discharge status: Home / Self Care | Payer: Self-pay | Attending: Emergency Medicine | Admitting: Emergency Medicine

## 2018-08-05 DIAGNOSIS — F17210 Nicotine dependence, cigarettes, uncomplicated: Secondary | ICD-10-CM | POA: Insufficient documentation

## 2018-08-05 DIAGNOSIS — I10 Essential (primary) hypertension: Secondary | ICD-10-CM | POA: Insufficient documentation

## 2018-08-05 DIAGNOSIS — Z79899 Other long term (current) drug therapy: Secondary | ICD-10-CM | POA: Insufficient documentation

## 2018-08-05 DIAGNOSIS — R112 Nausea with vomiting, unspecified: Secondary | ICD-10-CM | POA: Insufficient documentation

## 2018-08-05 LAB — URINALYSIS, ROUTINE W REFLEX MICROSCOPIC
Bilirubin Urine: NEGATIVE
Glucose, UA: NEGATIVE mg/dL
Hgb urine dipstick: NEGATIVE
Ketones, ur: NEGATIVE mg/dL
Leukocytes,Ua: NEGATIVE
Nitrite: NEGATIVE
Protein, ur: NEGATIVE mg/dL
Specific Gravity, Urine: 1.004 — ABNORMAL LOW (ref 1.005–1.030)
pH: 5 (ref 5.0–8.0)

## 2018-08-05 LAB — COMPREHENSIVE METABOLIC PANEL
ALT: 22 U/L (ref 0–44)
AST: 35 U/L (ref 15–41)
Albumin: 3.8 g/dL (ref 3.5–5.0)
Alkaline Phosphatase: 71 U/L (ref 38–126)
Anion gap: 11 (ref 5–15)
BUN: 9 mg/dL (ref 8–23)
CO2: 20 mmol/L — ABNORMAL LOW (ref 22–32)
Calcium: 9.2 mg/dL (ref 8.9–10.3)
Chloride: 101 mmol/L (ref 98–111)
Creatinine, Ser: 0.77 mg/dL (ref 0.44–1.00)
GFR calc Af Amer: >60 mL/min (ref >60)
GFR calc non Af Amer: >60 mL/min (ref >60)
Glucose, Bld: 95 mg/dL (ref 70–99)
Potassium: 3.4 mmol/L — ABNORMAL LOW (ref 3.5–5.1)
Sodium: 132 mmol/L — ABNORMAL LOW (ref 135–145)
Total Bilirubin: 0.6 mg/dL (ref 0.3–1.2)
Total Protein: 7.1 g/dL (ref 6.5–8.1)

## 2018-08-05 LAB — CBC
HCT: 37 % (ref 36.0–46.0)
Hemoglobin: 12.7 g/dL (ref 12.0–15.0)
MCH: 34.1 pg — ABNORMAL HIGH (ref 26.0–34.0)
MCHC: 34.3 g/dL (ref 30.0–36.0)
MCV: 99.5 fL (ref 80.0–100.0)
Platelets: 185 10*3/uL (ref 150–400)
RBC: 3.72 MIL/uL — ABNORMAL LOW (ref 3.87–5.11)
RDW: 14.5 % (ref 11.5–15.5)
WBC: 7.4 10*3/uL (ref 4.0–10.5)
nRBC: 0.3 % — ABNORMAL HIGH (ref 0.0–0.2)

## 2018-08-05 LAB — LIPASE, BLOOD: Lipase: 32 U/L (ref 11–51)

## 2018-08-05 MED ORDER — SODIUM CHLORIDE 0.9% FLUSH
3.0000 mL | Freq: Once | INTRAVENOUS | Status: AC
  Administered 2018-08-05: 20:00:00 3 mL via INTRAVENOUS

## 2018-08-05 MED ORDER — PROMETHAZINE HCL 25 MG/ML IJ SOLN
12.5000 mg | Freq: Once | INTRAMUSCULAR | Status: DC
  Filled 2018-08-05: qty 1

## 2018-08-05 MED ORDER — IOHEXOL 300 MG/ML  SOLN
100.0000 mL | Freq: Once | INTRAMUSCULAR | Status: AC | PRN
  Administered 2018-08-05: 100 mL via INTRAVENOUS

## 2018-08-05 MED ORDER — METOCLOPRAMIDE HCL 10 MG PO TABS: 10.0000 mg | ORAL_TABLET | Freq: Four times a day (QID) | ORAL | 0 refills | Status: DC | PRN

## 2018-08-05 MED ORDER — SODIUM CHLORIDE 0.9 % IV BOLUS
1000.0000 mL | Freq: Once | INTRAVENOUS | Status: AC
  Administered 2018-08-05: 1000 mL via INTRAVENOUS

## 2018-08-05 NOTE — ED Notes (Signed)
Patient transported to CT 

## 2018-08-05 NOTE — Discharge Instructions (Signed)
Stay hydrated.   You likely have a stomach virus   Take reglan for nausea.  See your doctor  Return to ER if you have worse vomiting, dehydration, fever.,

## 2018-08-05 NOTE — ED Triage Notes (Signed)
Onset 1 week abd pain, body  Aches, and vomiting every morning around 7or8am upon awakening. Pt took Zofran and another medication with no relief.

## 2018-08-05 NOTE — ED Notes (Signed)
All appropriate discharge materials reviewed with patient at length. Time for questions provided. Pt denies any further questions at this time. Verbalizes understanding of all provided materials.  

## 2018-08-05 NOTE — ED Provider Notes (Signed)
[facility] Provider Note   CSN: [id number] Arrival date & time: 08/05/18  1726     History   Chief Complaint Chief Complaint  Patient presents with  . Emesis    HPI Rhonda Best is a 63 y.o. female history of hypertension, depression who presented with abdominal pain, chills, vomiting.  Patient has been having intermittent abdominal pain as well as vomiting for the last week or so.  She states that she feels nauseated and has been vomiting every single day.  She also has some left flank pain as well.  Patient also has some chills and myalgias as well .  She was tested for COVID and apparently was negative. She came to the ED about a week ago for chest pain and had negative troponins.      The history is provided by the patient.    Past Medical History:  Diagnosis Date  . Hypertension     Patient Active Problem List   Diagnosis Date Noted  . Essential hypertension 11/12/2017  . Current episode of major depressive disorder without prior episode 11/12/2017  . Anxiety 11/12/2017    History reviewed. No pertinent surgical history.   OB History   No obstetric history on file.      Home Medications    Prior to Admission medications   Medication Sig Start Date End Date Taking? Authorizing Provider  amLODipine (NORVASC) 10 MG tablet Take 1 tablet (10 mg total) by mouth daily. 11/10/17   Bing NeighborsHarris, Kimberly S, FNP  busPIRone (BUSPAR) 10 MG tablet Take 1 tablet (10 mg total) by mouth 3 (three) times daily. 11/10/17   Bing NeighborsHarris, Kimberly S, FNP  FLUoxetine (PROZAC) 20 MG tablet Take 1 tablet (20 mg total) by mouth daily. 11/10/17   Bing NeighborsHarris, Kimberly S, FNP  predniSONE (DELTASONE) 20 MG tablet 3 tabs po day one, then 2 po daily x 4 days 07/24/18   Mesner, Barbara CowerJason, MD    Family History Family History  Problem Relation Age of Onset  . Healthy Mother     Social History Social History   Tobacco Use  . Smoking status: Current Every Day  Smoker    Packs/day: 0.05    Types: Cigarettes  . Smokeless tobacco: Never Used  Substance Use Topics  . Alcohol use: Yes    Comment: Socially   . Drug use: No     Allergies   Patient has no known allergies.   Review of Systems Review of Systems  Gastrointestinal: Positive for abdominal pain and vomiting.  All other systems reviewed and are negative.    Physical Exam Updated Vital Signs BP 130/84 (BP Location: Left Arm)   Pulse 75   Temp 98.1 F (36.7 C) (Oral)   Resp 18   Ht 5\' 3"  (1.6 m)   SpO2 100%   BMI 27.39 kg/m   Physical Exam Vitals signs and nursing note reviewed.  HENT:     Head: Normocephalic.     Right Ear: Tympanic membrane normal.     Left Ear: Tympanic membrane normal.     Nose: Nose normal.     Mouth/Throat:     Mouth: Mucous membranes are moist.  Eyes:     Extraocular Movements: Extraocular movements intact.     Pupils: Pupils are equal, round, and reactive to light.  Neck:     Musculoskeletal: Normal range of motion.  Cardiovascular:     Rate and Rhythm: Normal rate and regular rhythm.  Pulses: Normal pulses.     Heart sounds: Normal heart sounds.  Pulmonary:     Effort: Pulmonary effort is normal.     Breath sounds: Normal breath sounds.  Abdominal:     General: Abdomen is flat.     Palpations: Abdomen is soft.     Comments: Mild epigastric tenderness   Musculoskeletal: Normal range of motion.  Skin:    General: Skin is warm.  Neurological:     General: No focal deficit present.     Mental Status: She is alert and oriented to person, place, and time.  Psychiatric:        Mood and Affect: Mood normal.      ED Treatments / Results  Labs (all labs ordered are listed, but only abnormal results are displayed) Labs Reviewed  COMPREHENSIVE METABOLIC PANEL - Abnormal; Notable for the following components:      Result Value   Sodium 132 (*)    Potassium 3.4 (*)    CO2 20 (*)    All other components within normal limits  CBC  - Abnormal; Notable for the following components:   RBC 3.72 (*)    MCH 34.1 (*)    nRBC 0.3 (*)    All other components within normal limits  URINALYSIS, ROUTINE W REFLEX MICROSCOPIC - Abnormal; Notable for the following components:   Color, Urine STRAW (*)    Specific Gravity, Urine 1.004 (*)    All other components within normal limits  LIPASE, BLOOD    EKG EKG Interpretation  Date/Time:  Wednesday August 05 2018 19:47:46 EDT Ventricular Rate:  77 PR Interval:    QRS Duration: 92 QT Interval:  378 QTC Calculation: 428 R Axis:   69 Text Interpretation:  Sinus rhythm No significant change since last tracing Confirmed by Wandra Arthurs [phone]) on 08/05/2018 7:49:06 PM   Radiology No results found.  Procedures Procedures (including critical care time)  Medications Ordered in ED Medications  promethazine (PHENERGAN) injection 12.5 mg (12.5 mg Intravenous Refused 08/05/18 2007)  sodium chloride flush (NS) 0.9 % injection 3 mL (3 mLs Intravenous Given 08/05/18 2008)  sodium chloride 0.9 % bolus 1,000 mL (1,000 mLs Intravenous New Bag/Given 08/05/18 2007)     Initial Impression / Assessment and Plan / ED Course  I have reviewed the triage vital signs and the nursing notes.  Pertinent labs & imaging results that were available during my care of the patient were reviewed by me and considered in my medical decision making (see chart for details).       Rhonda Best is a 63 y.o. female here with abdominal pain, vomiting. Consider gastroenteritis as colitis versus diverticulitis. Patient recently tested negative for COVID.   9:30 PM Labs and UA and CT ab/pel unremarkable. No vomiting in the ED. Stable for discharge with reglan prn   Final Clinical Impressions(s) / ED Diagnoses   Final diagnoses:  None    ED Discharge Orders    None       Drenda Freeze, MD 08/05/18 2131

## 2018-08-05 NOTE — ED Notes (Signed)
ED Provider at bedside. 

## 2018-08-07 ENCOUNTER — Other Ambulatory Visit: Payer: Self-pay

## 2018-08-07 ENCOUNTER — Ambulatory Visit (INDEPENDENT_AMBULATORY_CARE_PROVIDER_SITE_OTHER): Payer: Self-pay | Admitting: Family Medicine

## 2018-08-07 ENCOUNTER — Encounter: Payer: Self-pay | Admitting: Family Medicine

## 2018-08-07 ENCOUNTER — Telehealth: Payer: Self-pay | Admitting: Family Medicine

## 2018-08-07 DIAGNOSIS — B349 Viral infection, unspecified: Secondary | ICD-10-CM

## 2018-08-07 DIAGNOSIS — Z0289 Encounter for other administrative examinations: Secondary | ICD-10-CM

## 2018-08-07 NOTE — Progress Notes (Signed)
Virtual Visit via Telephone Note  I connected with Rhonda Best on 08/07/18 at 11:10 AM EDT by telephone and verified that I am speaking with the correct person using two identifiers.  Location: Patient: Located at home during today's encounter  Provider: Located at primary care office     I discussed the limitations, risks, security and privacy concerns of performing an evaluation and management service by telephone and the availability of in person appointments. I also discussed with the patient that there may be a patient responsible charge related to this service. The patient expressed understanding and agreed to proceed.   History of Present Illness: Rhonda Best is present during today's telemedicine encounter continuing to have concern for possible COVID-19 infection.  Patient reports her manager contacted her on today and advised that she needed to report to work tomorrow.  Loletha Grayer states that her manager said that she had evidence of a negative COVID-19 infection however patient reports she has not received any results.  Apparently patient received a COVID-19 test at a community test while event several weeks ago and has remained out of work since July 15, 2018.  She was seen in the ER this week with GI symptoms and today reports that she is having shortness of breath although is able to speak in full and complete sentences without coughing or any distress.  She refused COVID testing this week while in the ER given that there were no COVID results attached to her chart.  She reports today that she does not want to receive a COVID test through [facility] as she does not want to go through the testing process yet again.  She denies fever although complains of cough and shortness of breath along with GI symptoms persisting. Assessment and Plan: 1. Viral illness -Patient reports that she had been tested for COVID around latter part of June.  She has been unable to supply me with any test  results and this is her second request for time away from work.  She has been out for suspected COVID symptoms since June 24 and has refused COVID testing through [facility] health.  Therefore patient was advised she must return to work on Tuesday or obtain a COVID-19 test and she can be written out of work while test results are pending.  She continues to refuse testing therefore work note provided advising patient can return to work on Monday.  No additional time off will be granted.  2. Encounter to obtain excuse from work -Work note provided with a recommendation return to work on 08/11/2018, unless patient has a verifiable pending COVID 19 test.    Patient advised that I would only continue her out of work through Monday as she recently declined to [facility] test 2 days ago and is unable to provide any documentation related to the [facility] test she took the latter part of June.  She has been out of work since June 24 due to suspected COVID symptoms.  Most recently was seen at the ER with GI symptoms and again refused to be COVID tested.   Follow Up Instructions: Schedule HTN follow-up    I discussed the assessment and treatment plan with the patient. The patient was provided an opportunity to ask questions and all were answered. The patient agreed with the plan and demonstrated an understanding of the instructions.   The patient was advised to call back or seek an in-person evaluation if the symptoms worsen or if the condition fails to improve  as anticipated.  I provided 20 minutes of non-face-to-face time during this encounter.   Joaquin CourtsKimberly Reannah Totten, FNP

## 2018-08-07 NOTE — Telephone Encounter (Signed)
Patient wants to medication to stop her throwing up

## 2018-08-07 NOTE — Progress Notes (Deleted)
Worked up patient for their telephone visit with provider Molli Barrows, FNP-C. Verified date of birth. Patient having c/o vomiting, chills, muscle aches, SHOB, weakness, cough. KWalker, CMA.

## 2018-08-09 ENCOUNTER — Emergency Department (HOSPITAL_COMMUNITY)
Admission: EM | Admit: 2018-08-09 | Discharge: 2018-08-09 | Disposition: A | Discharge status: Home / Self Care | Payer: Self-pay | Attending: Emergency Medicine | Admitting: Emergency Medicine

## 2018-08-09 ENCOUNTER — Emergency Department (HOSPITAL_COMMUNITY): Payer: Self-pay

## 2018-08-09 ENCOUNTER — Other Ambulatory Visit: Payer: Self-pay

## 2018-08-09 DIAGNOSIS — I10 Essential (primary) hypertension: Secondary | ICD-10-CM | POA: Insufficient documentation

## 2018-08-09 DIAGNOSIS — F17210 Nicotine dependence, cigarettes, uncomplicated: Secondary | ICD-10-CM | POA: Insufficient documentation

## 2018-08-09 DIAGNOSIS — R0789 Other chest pain: Secondary | ICD-10-CM | POA: Insufficient documentation

## 2018-08-09 DIAGNOSIS — R1110 Vomiting, unspecified: Secondary | ICD-10-CM | POA: Insufficient documentation

## 2018-08-09 DIAGNOSIS — R0600 Dyspnea, unspecified: Secondary | ICD-10-CM | POA: Insufficient documentation

## 2018-08-09 DIAGNOSIS — Z20828 Contact with and (suspected) exposure to other viral communicable diseases: Secondary | ICD-10-CM | POA: Insufficient documentation

## 2018-08-09 LAB — CBC
HCT: 38.6 % (ref 36.0–46.0)
Hemoglobin: 13.5 g/dL (ref 12.0–15.0)
MCH: 34.4 pg — ABNORMAL HIGH (ref 26.0–34.0)
MCHC: 35 g/dL (ref 30.0–36.0)
MCV: 98.5 fL (ref 80.0–100.0)
Platelets: 221 10*3/uL (ref 150–400)
RBC: 3.92 MIL/uL (ref 3.87–5.11)
RDW: 14.5 % (ref 11.5–15.5)
WBC: 6.4 10*3/uL (ref 4.0–10.5)
nRBC: 0 % (ref 0.0–0.2)

## 2018-08-09 LAB — COMPREHENSIVE METABOLIC PANEL
ALT: 32 U/L (ref 0–44)
AST: 105 U/L — ABNORMAL HIGH (ref 15–41)
Albumin: 3.9 g/dL (ref 3.5–5.0)
Alkaline Phosphatase: 91 U/L (ref 38–126)
Anion gap: 17 — ABNORMAL HIGH (ref 5–15)
BUN: 10 mg/dL (ref 8–23)
CO2: 19 mmol/L — ABNORMAL LOW (ref 22–32)
Calcium: 9 mg/dL (ref 8.9–10.3)
Chloride: 98 mmol/L (ref 98–111)
Creatinine, Ser: 0.63 mg/dL (ref 0.44–1.00)
GFR calc Af Amer: >60 mL/min (ref >60)
GFR calc non Af Amer: >60 mL/min (ref >60)
Glucose, Bld: 78 mg/dL (ref 70–99)
Potassium: 4.1 mmol/L (ref 3.5–5.1)
Sodium: 134 mmol/L — ABNORMAL LOW (ref 135–145)
Total Bilirubin: 0.8 mg/dL (ref 0.3–1.2)
Total Protein: 7.2 g/dL (ref 6.5–8.1)

## 2018-08-09 LAB — TROPONIN I (HIGH SENSITIVITY)
Troponin I (High Sensitivity): 6 ng/L (ref <18)
Troponin I (High Sensitivity): 5 ng/L (ref <18)

## 2018-08-09 LAB — LIPASE, BLOOD: Lipase: 22 U/L (ref 11–51)

## 2018-08-09 MED ORDER — ONDANSETRON HCL 4 MG/2ML IJ SOLN
4.0000 mg | Freq: Once | INTRAMUSCULAR | Status: AC
  Administered 2018-08-09: 4 mg via INTRAVENOUS
  Filled 2018-08-09: qty 2

## 2018-08-09 MED ORDER — ONDANSETRON 4 MG PO TBDP: ORAL_TABLET | ORAL | 0 refills | Status: DC

## 2018-08-09 MED ORDER — SODIUM CHLORIDE 0.9 % IV BOLUS
500.0000 mL | Freq: Once | INTRAVENOUS | Status: AC
  Administered 2018-08-09: 500 mL via INTRAVENOUS

## 2018-08-09 NOTE — ED Provider Notes (Signed)
[facility]ENC[facility] Provider Note   CSN: [id number] Arrival date & time: 08/09/18  1555    History   Chief Complaint Chief Complaint  Patient presents with  . Shortness of Breath  . Emesis    HPI Rhonda Best is a 63 y.o. female.     Patient has history of high blood pressure presents with intermittent shortness of breath, chest tightness and vomiting for the past week.  Patient was seen recently in the emergency room and has not improved since that time.  Patient lost her Reglan prescription.  Patient has potential sick contacts with no resulted COVID test.  Chest tightness only last for seconds.  No cardiac history.  No recent surgeries no blood clot history.  Mild productive cough.     Past Medical History:  Diagnosis Date  . Hypertension     Patient Active Problem List   Diagnosis Date Noted  . Essential hypertension 11/12/2017  . Current episode of major depressive disorder without prior episode 11/12/2017  . Anxiety 11/12/2017    No past surgical history on file.   OB History   No obstetric history on file.      Home Medications    Prior to Admission medications   Medication Sig Start Date End Date Taking? Authorizing Provider  amLODipine (NORVASC) 10 MG tablet Take 1 tablet (10 mg total) by mouth daily. 11/10/17   Rhonda Best, Rhonda Best, Rhonda Best  busPIRone (BUSPAR) 10 MG tablet Take 1 tablet (10 mg total) by mouth 3 (three) times daily. 11/10/17   Rhonda Best, Rhonda Best, Rhonda Best  FLUoxetine (PROZAC) 20 MG tablet Take 1 tablet (20 mg total) by mouth daily. 11/10/17   Rhonda Best, Rhonda Best, Rhonda Best  metoCLOPramide (REGLAN) 10 MG tablet Take 1 tablet (10 mg total) by mouth every 6 (six) hours as needed for nausea (nausea/headache). 08/05/18   Rhonda Best, Rhonda Hsienta, Rhonda Best  ondansetron (ZOFRAN ODT) 4 MG disintegrating tablet 4mg  ODT q4 hours prn nausea/vomit 08/09/18   Rhonda Best, Rhonda Lance, Rhonda Best    Family History Family History  Problem Relation Age of Onset   . Healthy Mother     Social History Social History   Tobacco Use  . Smoking status: Current Every Day Smoker    Packs/day: 0.05    Types: Cigarettes  . Smokeless tobacco: Never Used  Substance Use Topics  . Alcohol use: Yes    Comment: Socially   . Drug use: No     Allergies   Patient has no known allergies.   Review of Systems Review of Systems  Constitutional: Negative for chills and fever.  HENT: Negative for congestion.   Eyes: Negative for visual disturbance.  Respiratory: Positive for cough, chest tightness and shortness of breath.   Cardiovascular: Negative for chest pain.  Gastrointestinal: Positive for nausea and vomiting. Negative for abdominal pain.  Genitourinary: Negative for dysuria and flank pain.  Musculoskeletal: Negative for back pain, neck pain and neck stiffness.  Skin: Negative for rash.  Neurological: Negative for light-headedness and headaches.     Physical Exam Updated Vital Signs BP 135/80 (BP Location: Right Arm)   Pulse 73   Temp 98.4 F (36.9 C) (Oral)   Resp 18   SpO2 99%   Physical Exam Vitals signs and nursing note reviewed.  Constitutional:      Appearance: She is well-developed.  HENT:     Head: Normocephalic and atraumatic.     Comments: dry Eyes:     General:  Right eye: No discharge.        Left eye: No discharge.     Conjunctiva/sclera: Conjunctivae normal.  Neck:     Musculoskeletal: Normal range of motion and neck supple.     Trachea: No tracheal deviation.  Cardiovascular:     Rate and Rhythm: Normal rate and regular rhythm.  Pulmonary:     Effort: Pulmonary effort is normal.     Breath sounds: Normal breath sounds.  Abdominal:     General: There is no distension.     Palpations: Abdomen is soft.     Tenderness: There is no abdominal tenderness. There is no guarding.  Skin:    General: Skin is warm.     Findings: No rash.  Neurological:     Mental Status: She is alert and oriented to person,  place, and time.      ED Treatments / Results  Labs (all labs ordered are listed, but only abnormal results are displayed) Labs Reviewed  COMPREHENSIVE METABOLIC PANEL - Abnormal; Notable for the following components:      Result Value   Sodium 134 (*)    CO2 19 (*)    AST 105 (*)    Anion gap 17 (*)    All other components within normal limits  CBC - Abnormal; Notable for the following components:   MCH 34.4 (*)    All other components within normal limits  SARS CORONAVIRUS 2 (HOSPITAL ORDER, [facility])  LIPASE, BLOOD  TROPONIN I (HIGH SENSITIVITY)  TROPONIN I (HIGH SENSITIVITY)    EKG EKG Interpretation  Date/Time:  Sunday August 09 2018 16:07:23 EDT Ventricular Rate:  79 PR Interval:  142 QRS Duration: 68 QT Interval:  366 QTC Calculation: 419 R Axis:   45 Text Interpretation:  Normal sinus rhythm Normal ECG Confirmed by Elnora Morrison [phone]) on 08/09/2018 4:43:20 PM   Radiology Dg Chest Portable 1 View  Result Date: 08/09/2018 CLINICAL DATA:  2-3 day history of shortness of breath with exertion and productive cough. Ten day history of nausea and vomiting. EXAM: PORTABLE CHEST 1 VIEW COMPARISON:  08/05/2018 and earlier. FINDINGS: Cardiac silhouette normal in size, unchanged. Thoracic aorta mildly atherosclerotic, unchanged over multiple prior exams. Lungs clear. Bronchovascular markings normal. Pulmonary vascularity normal. No visible pleural effusions. No pneumothorax. IMPRESSION: No acute cardiopulmonary disease. Electronically Signed   By: Evangeline Dakin M.D.   On: 08/09/2018 17:05    Procedures Procedures (including critical care time)  Medications Ordered in ED Medications  sodium chloride 0.9 % bolus 500 mL (0 mLs Intravenous Stopped 08/09/18 2004)  ondansetron (ZOFRAN) injection 4 mg (4 mg Intravenous Given 08/09/18 1729)     Initial Impression / Assessment and Plan / ED Course  I have reviewed the triage vital signs and the  nursing notes.  Pertinent labs & imaging results that were available during my care of the patient were reviewed by me and considered in my medical decision making (see chart for details).       Patient presents with worsening symptoms for the past week.  With age, smoking history and high blood pressure history plan for delta troponin given chest tightness nausea and vomiting.  Chest tightness lasts only seconds however with second visit plan for ACS rule out.  Patient does have shortness of breath and cough as well plan for chest x-ray which was reviewed no acute findings, COVID test pending.  General blood work pending.  IV fluids and nausea medicines.  Patient with atypical chest pain, delta troponin negative.  Patient stable for close outpatient follow-up with her primary doctor this week for this.  With cough and vomiting patient initially agreed to cover test however refused.  Patient has an outpatient cover test pending she can follow-up with.  Blood work reviewed bicarb mild low 19 and sodium 134 consistent with mild dehydration.  IV fluids and oral fluids given.  Patient passed fluid challenge.  Close outpatient follow-up and reasons to return given.  Patient comfortable going home.  Vital signs normal at discharge.  Final Clinical Impressions(Best) / ED Diagnoses   Final diagnoses:  Acute dyspnea  Vomiting in adult    ED Discharge Orders         Ordered    ondansetron (ZOFRAN ODT) 4 MG disintegrating tablet     08/09/18 2046           Rhonda Best, Cera Rorke, Rhonda Best 08/09/18 2048

## 2018-08-09 NOTE — ED Notes (Signed)
Patient Alert and oriented to baseline. Stable and ambulatory to baseline. Patient verbalized understanding of the discharge instructions.  Patient belongings were taken by the patient.   

## 2018-08-09 NOTE — ED Notes (Signed)
Pt refused COVID test at this time. States she already had it done once and she will just wait on those results.

## 2018-08-09 NOTE — ED Triage Notes (Signed)
Pt here for evaluation of ongoing shob with exertion, productive cough, and throwing up mucous. Seen for same last week and given prescription for Reglan but has misplaced the prescription so has had no relief. 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 Has had covid test but it has not resulted yet.

## 2018-08-09 NOTE — Discharge Instructions (Signed)
Take Zofran as needed for nausea and vomiting. Stay well-hydrated. Follow-up with your local doctor early this week or return the emergency room for worsening or significant symptoms including chest pain, worsening shortness of breath, passing out or other.

## 2018-10-29 ENCOUNTER — Encounter (HOSPITAL_COMMUNITY): Payer: Self-pay | Admitting: Emergency Medicine

## 2018-10-29 ENCOUNTER — Emergency Department (HOSPITAL_COMMUNITY)
Admission: EM | Admit: 2018-10-29 | Discharge: 2018-10-29 | Disposition: A | Discharge status: Home / Self Care | Payer: Self-pay | Attending: Emergency Medicine | Admitting: Emergency Medicine

## 2018-10-29 ENCOUNTER — Other Ambulatory Visit: Payer: Self-pay

## 2018-10-29 ENCOUNTER — Emergency Department (HOSPITAL_COMMUNITY): Payer: Self-pay

## 2018-10-29 DIAGNOSIS — Z20828 Contact with and (suspected) exposure to other viral communicable diseases: Secondary | ICD-10-CM | POA: Insufficient documentation

## 2018-10-29 DIAGNOSIS — Z20822 Contact with and (suspected) exposure to covid-19: Secondary | ICD-10-CM

## 2018-10-29 DIAGNOSIS — I10 Essential (primary) hypertension: Secondary | ICD-10-CM | POA: Insufficient documentation

## 2018-10-29 DIAGNOSIS — Z79899 Other long term (current) drug therapy: Secondary | ICD-10-CM | POA: Insufficient documentation

## 2018-10-29 DIAGNOSIS — F17210 Nicotine dependence, cigarettes, uncomplicated: Secondary | ICD-10-CM | POA: Insufficient documentation

## 2018-10-29 DIAGNOSIS — R030 Elevated blood-pressure reading, without diagnosis of hypertension: Secondary | ICD-10-CM

## 2018-10-29 LAB — BASIC METABOLIC PANEL
Anion gap: 17 — ABNORMAL HIGH (ref 5–15)
BUN: <5 mg/dL — ABNORMAL LOW (ref 8–23)
CO2: 20 mmol/L — ABNORMAL LOW (ref 22–32)
Calcium: 9.2 mg/dL (ref 8.9–10.3)
Chloride: 97 mmol/L — ABNORMAL LOW (ref 98–111)
Creatinine, Ser: 0.64 mg/dL (ref 0.44–1.00)
GFR calc Af Amer: >60 mL/min (ref >60)
GFR calc non Af Amer: >60 mL/min (ref >60)
Glucose, Bld: 92 mg/dL (ref 70–99)
Potassium: 4 mmol/L (ref 3.5–5.1)
Sodium: 134 mmol/L — ABNORMAL LOW (ref 135–145)

## 2018-10-29 LAB — CBC WITH DIFFERENTIAL/PLATELET
Abs Immature Granulocytes: 0.01 10*3/uL (ref 0.00–0.07)
Basophils Absolute: 0 10*3/uL (ref 0.0–0.1)
Basophils Relative: 0 %
Eosinophils Absolute: 0 10*3/uL (ref 0.0–0.5)
Eosinophils Relative: 1 %
HCT: 39.6 % (ref 36.0–46.0)
Hemoglobin: 13.7 g/dL (ref 12.0–15.0)
Immature Granulocytes: 0 %
Lymphocytes Relative: 53 %
Lymphs Abs: 2.4 10*3/uL (ref 0.7–4.0)
MCH: 34.3 pg — ABNORMAL HIGH (ref 26.0–34.0)
MCHC: 34.6 g/dL (ref 30.0–36.0)
MCV: 99 fL (ref 80.0–100.0)
Monocytes Absolute: 0.4 10*3/uL (ref 0.1–1.0)
Monocytes Relative: 9 %
Neutro Abs: 1.6 10*3/uL — ABNORMAL LOW (ref 1.7–7.7)
Neutrophils Relative %: 37 %
Platelets: 201 10*3/uL (ref 150–400)
RBC: 4 MIL/uL (ref 3.87–5.11)
RDW: 15.2 % (ref 11.5–15.5)
WBC: 4.5 10*3/uL (ref 4.0–10.5)
nRBC: 0 % (ref 0.0–0.2)

## 2018-10-29 LAB — URINALYSIS, ROUTINE W REFLEX MICROSCOPIC
Bilirubin Urine: NEGATIVE
Glucose, UA: NEGATIVE mg/dL
Hgb urine dipstick: NEGATIVE
Ketones, ur: 80 mg/dL — AB
Nitrite: NEGATIVE
Protein, ur: 100 mg/dL — AB
Specific Gravity, Urine: 1.014 (ref 1.005–1.030)
pH: 6 (ref 5.0–8.0)

## 2018-10-29 LAB — CBG MONITORING, ED: Glucose-Capillary: 88 mg/dL (ref 70–99)

## 2018-10-29 LAB — HEPATIC FUNCTION PANEL
ALT: 38 U/L (ref 0–44)
AST: 112 U/L — ABNORMAL HIGH (ref 15–41)
Albumin: 4.3 g/dL (ref 3.5–5.0)
Alkaline Phosphatase: 97 U/L (ref 38–126)
Bilirubin, Direct: 0.2 mg/dL (ref 0.0–0.2)
Indirect Bilirubin: 1 mg/dL — ABNORMAL HIGH (ref 0.3–0.9)
Total Bilirubin: 1.2 mg/dL (ref 0.3–1.2)
Total Protein: 7.7 g/dL (ref 6.5–8.1)

## 2018-10-29 LAB — D-DIMER, QUANTITATIVE: D-Dimer, Quant: 0.27 ug/mL-FEU (ref 0.00–0.50)

## 2018-10-29 LAB — LIPASE, BLOOD: Lipase: 20 U/L (ref 11–51)

## 2018-10-29 LAB — TROPONIN I (HIGH SENSITIVITY): Troponin I (High Sensitivity): 4 ng/L (ref <18)

## 2018-10-29 MED ORDER — SODIUM CHLORIDE 0.9 % IV BOLUS
500.0000 mL | Freq: Once | INTRAVENOUS | Status: AC
  Administered 2018-10-29: 500 mL via INTRAVENOUS

## 2018-10-29 MED ORDER — BENZONATATE 100 MG PO CAPS: 100.0000 mg | ORAL_CAPSULE | Freq: Three times a day (TID) | ORAL | 0 refills | Status: DC

## 2018-10-29 MED ORDER — AMLODIPINE BESYLATE 5 MG PO TABS
10.0000 mg | ORAL_TABLET | Freq: Once | ORAL | Status: AC
  Administered 2018-10-29: 19:00:00 10 mg via ORAL
  Filled 2018-10-29: qty 2

## 2018-10-29 NOTE — ED Triage Notes (Signed)
Pt presents with a week and a half of weakness, vomiting, and sob. Pt reports she vomits every morning and then feels better but it still continues every morning.

## 2018-10-29 NOTE — Discharge Instructions (Addendum)
You have been diagnosed today with suspected COVID-19 virus infection.  At this time there does not appear to be the presence of an emergent medical condition, however there is always the potential for conditions to change. Please read and follow the below instructions.  Please return to the Emergency Department immediately for any new or worsening symptoms or if your symptoms do not improve within 3 days. Please be sure to follow up with your Primary Care Provider within one week regarding your visit today; please call their office to schedule an appointment even if you are feeling better for a follow-up visit. You may use the cough medication Tessalon as prescribed to help with your symptoms.  Please be sure to drink plenty of water and get plenty of rest over the next few days. You have been tested for the COVID-19 virus today.  Your test results will be available on your MyChart account in the next 1-2 days.  Please continue self isolation for the next 2 weeks and symptom-free for 7 days despite testing results.  There is a possibility of a false negative test so I strongly encourage that you self isolate even if you have a negative COVID-19 test today.  Please call your primary care provider tomorrow morning to schedule a follow-up tele-visit within 1 week.  Return to emerge department immediately for any new or worsening symptoms. Your blood pressure was elevated today.  Please be sure to take your blood pressure medications as directed by your primary care provider.  Call your primary care doctor's office tomorrow morning to schedule a blood pressure recheck and medication management within 1 week.  Get help right away if: You have trouble breathing. You have a severe headache or a stiff neck. You have severe vomiting or abdominal pain. You have chest pain or shortness of breath Start to feel mixed up (confused). Feel weak or numb. Feel faint. Have very bad pain in your: Chest. Belly  (abdomen). Throw up more than once. You have any new/concerning or worsening symptoms  Please read the additional information packets attached to your discharge summary.  Do not take your medicine if  develop an itchy rash, swelling in your mouth or lips, or difficulty breathing; call 911 and seek immediate emergency medical attention if this occurs.  Note: Portions of this text may have been transcribed using voice recognition software. Every effort was made to ensure accuracy; however, inadvertent computerized transcription errors may still be present.

## 2018-10-29 NOTE — ED Provider Notes (Signed)
[facility] Provider Note   CSN: [id number] Arrival date & time: 10/29/18  1108     History   Chief Complaint Chief Complaint  Patient presents with  . Weakness  . Emesis  . Shortness of Breath    HPI Rhonda Best is a 63 y.o. female with history of hypertension and anxiety presents today for a 10-day history of fatigue, chills, cough, decreased appetite, posttussive emesis and shortness of breath.  Patient describes a gradual onset of symptoms 10 days ago that have been constant worsening and without clear aggravating or alleviating factors.  No medication prior to arrival.  She describes a generalized fatigue without focal weakness.  Reports chills without measured fever at home.  Cough with clear sputum and occasional clear posttussive emesis.  Reports decreased appetite and nausea without abdominal pain.  Shortness of breath described as feeling of increased fatigue and inability to take a full breath, denies pain.  Patient reports negative COVID-19 test in July of this year.  Denies fever, headache/vision changes, neck pain/stiffness, chest pain, abdominal pain, diarrhea, dysuria/hematuria, extremity pain/swelling, color change or any additional concerns.    HPI  Past Medical History:  Diagnosis Date  . Hypertension     Patient Active Problem List   Diagnosis Date Noted  . Essential hypertension 11/12/2017  . Current episode of major depressive disorder without prior episode 11/12/2017  . Anxiety 11/12/2017    History reviewed. No pertinent surgical history.   OB History   No obstetric history on file.      Home Medications    Prior to Admission medications   Medication Sig Start Date End Date Taking? Authorizing Provider  amLODipine (NORVASC) 10 MG tablet Take 1 tablet (10 mg total) by mouth daily. 11/10/17   Scot Jun, FNP  busPIRone (BUSPAR) 10 MG tablet Take 1 tablet (10 mg total) by mouth 3 (three)  times daily. 11/10/17   Scot Jun, FNP  FLUoxetine (PROZAC) 20 MG tablet Take 1 tablet (20 mg total) by mouth daily. 11/10/17   Scot Jun, FNP  metoCLOPramide (REGLAN) 10 MG tablet Take 1 tablet (10 mg total) by mouth every 6 (six) hours as needed for nausea (nausea/headache). 08/05/18   Drenda Freeze, MD  ondansetron (ZOFRAN ODT) 4 MG disintegrating tablet 4mg  ODT q4 hours prn nausea/vomit 08/09/18   Elnora Morrison, MD    Family History Family History  Problem Relation Age of Onset  . Healthy Mother     Social History Social History   Tobacco Use  . Smoking status: Current Every Day Smoker    Packs/day: 0.05    Types: Cigarettes  . Smokeless tobacco: Never Used  Substance Use Topics  . Alcohol use: Yes    Comment: Socially   . Drug use: No     Allergies   Patient has no known allergies.   Review of Systems Review of Systems Ten systems are reviewed and are negative for acute change except as noted in the HPI   Physical Exam Updated Vital Signs BP (!) 175/98 (BP Location: Left Arm)   Pulse 69   Temp 98.2 F (36.8 C) (Oral)   Resp 16   Ht 5\' 1"  (1.549 m)   Wt 72.6 kg   LMP  (Exact Date)   SpO2 100%   BMI 30.23 kg/m   Physical Exam Constitutional:      General: She is not in acute distress.    Appearance: Normal appearance. She  is well-developed. She is not ill-appearing or diaphoretic.  HENT:     Head: Normocephalic and atraumatic.     Right Ear: External ear normal.     Left Ear: External ear normal.     Nose: Nose normal.  Eyes:     General: Vision grossly intact. Gaze aligned appropriately.     Pupils: Pupils are equal, round, and reactive to light.  Neck:     Musculoskeletal: Normal range of motion.     Trachea: Trachea and phonation normal. No tracheal deviation.  Cardiovascular:     Rate and Rhythm: Normal rate and regular rhythm.     Pulses: Normal pulses.  Pulmonary:     Effort: Pulmonary effort is normal. No accessory  muscle usage or respiratory distress.     Breath sounds: Normal breath sounds.  Chest:     Chest wall: No deformity, tenderness or crepitus.  Abdominal:     General: There is no distension.     Palpations: Abdomen is soft.     Tenderness: There is no abdominal tenderness. There is no guarding or rebound.  Musculoskeletal: Normal range of motion.     Right lower leg: She exhibits no tenderness. No edema.     Left lower leg: She exhibits no tenderness. No edema.  Skin:    General: Skin is warm and dry.  Neurological:     Mental Status: She is alert.     GCS: GCS eye subscore is 4. GCS verbal subscore is 5. GCS motor subscore is 6.     Comments: Speech is clear and goal oriented, follows commands Major Cranial nerves without deficit, no facial droop Moves extremities without ataxia, coordination intact  Psychiatric:        Behavior: Behavior normal.    ED Treatments / Results  Labs (all labs ordered are listed, but only abnormal results are displayed) Labs Reviewed  URINALYSIS, ROUTINE W REFLEX MICROSCOPIC - Abnormal; Notable for the following components:      Result Value   APPearance CLOUDY (*)    Ketones, ur 80 (*)    Protein, ur 100 (*)    Leukocytes,Ua TRACE (*)    Bacteria, UA RARE (*)    All other components within normal limits  CBC WITH DIFFERENTIAL/PLATELET - Abnormal; Notable for the following components:   MCH 34.3 (*)    Neutro Abs 1.6 (*)    All other components within normal limits  HEPATIC FUNCTION PANEL - Abnormal; Notable for the following components:   AST 112 (*)    Indirect Bilirubin 1.0 (*)    All other components within normal limits  BASIC METABOLIC PANEL - Abnormal; Notable for the following components:   Sodium 134 (*)    Chloride 97 (*)    CO2 20 (*)    BUN <5 (*)    Anion gap 17 (*)    All other components within normal limits  SARS CORONAVIRUS 2 (TAT 6-24 HRS)  LIPASE, BLOOD  D-DIMER, QUANTITATIVE (NOT AT [facility])  BASIC METABOLIC PANEL   CBG MONITORING, ED  TROPONIN I (HIGH SENSITIVITY)  TROPONIN I (HIGH SENSITIVITY)    EKG None  Radiology Dg Chest Portable 1 View  Result Date: 10/29/2018 CLINICAL DATA:  Shortness of breath. EXAM: PORTABLE CHEST 1 VIEW COMPARISON:  Chest radiograph 08/09/2018 FINDINGS: Monitoring leads overlie the patient. Normal cardiac and mediastinal contours. No consolidative pulmonary opacities. No pleural effusion or pneumothorax. IMPRESSION: No acute cardiopulmonary process. Electronically Signed   By: Francis Gaines.D.  On: 10/29/2018 19:26    Procedures Procedures (including critical care time)  Medications Ordered in ED Medications - No data to display   Initial Impression / Assessment and Plan / ED Course  I have reviewed the triage vital signs and the nursing notes.  Pertinent labs & imaging results that were available during my care of the patient were reviewed by me and considered in my medical decision making (see chart for details).    Urinalysis appears contaminated and patient appears dehydrated; no urinary symptoms, doubt UTI, fluid bolus given, discussed with Dr. Particia Nearing, no indication for culture as specimen contaminated.  BMP nonacute Lipase wnl HS Troponin: 4; symptoms ongoing x 10 days, no indication for delta Ddimer negative LFT nonacute CBC nonacute CBG: 88 CXR:  IMPRESSION:  No acute cardiopulmonary process.   EKG: reviewed with Dr. Particia Nearing without acute findings - Patient history and presentation today concerning for COVID-19 viral infection. No tachycardia or hypoxia on room air and no signs of pneumonia. No indication for antibiotics, admission or further workup at this time. Based on history and reassuring workup as above doubt ACS, PE, PNA, dissection or other acute cardiopulmonary etiology of patient's symptoms today. Additionally patient's abdomen is soft and nontender without peritoneal signs. Suspect few episodes of emesis as posttussive, doubt SBO,  perforation, cholecystitis, appendicitis or other acute abdominopelvic etiologies today. Patient tolerating PO without difficulty. Will treat patients cough with tessalon and encourage her to increase water intake and rest. Patient is aware to self-isolate until symptoms free x 7 days despite results of COVID test due to concern of possible false negative. She is to call PCP to schedule follow-up tele-visit this week and return to ER for any new or worsening symptoms.  Additionally patient informed of elevated blood pressure here in ER of which she is asymptomatic. She reports she only takes antihypertensives intermittently. I have encouraged her to adhere to medication per PCP instruction and to follow-up with them within one week for bp recheck and medication management. Patient informed of signs and symptoms of hypertensive urgency/emergency and to return to ER immediately if they occur.  At this time there does not appear to be any evidence of an acute emergency medical condition and the patient appears stable for discharge with appropriate outpatient follow up. Diagnosis was discussed with patient who verbalizes understanding of care plan and is agreeable to discharge. I have discussed return precautions with patient who verbalizes understanding of return precautions. Patient encouraged to follow-up with their PCP. All questions answered.  Patient's case rediscussed with Dr.Haviland who agrees with plan to discharge with follow-up.   Freddie P Carano was evaluated in Emergency Department on 10/29/2018 for the symptoms described in the history of present illness. She was evaluated in the context of the global COVID-19 pandemic, which necessitated consideration that the patient might be at risk for infection with the SARS-CoV-2 virus that causes COVID-19. Institutional protocols and algorithms that pertain to the evaluation of patients at risk for COVID-19 are in a state of rapid change based on  information released by regulatory bodies including the CDC and federal and state organizations. These policies and algorithms were followed during the patient's care in the ED.   Note: Portions of this report may have been transcribed using voice recognition software. Every effort was made to ensure accuracy; however, inadvertent computerized transcription errors may still be present. Final Clinical Impressions(s) / ED Diagnoses   Final diagnoses:  Suspected COVID-19 virus infection  Elevated blood pressure reading  ED Discharge Orders         Ordered    benzonatate (TESSALON) 100 MG capsule  Every 8 hours     10/29/18 2226           Elizabeth PalauMorelli, Sanav Remer A, PA-C 10/29/18 2240    Jacalyn LefevreHaviland, Julie, MD 10/29/18 2245

## 2018-10-29 NOTE — ED Notes (Signed)
This RN successful in getting an IV, however was unable to draw back any blood from IV. Patient is not allowing me to stick her for blood and is requesting phlebotomy at this time.

## 2018-10-29 NOTE — ED Notes (Signed)
Patient went to bathroom to collect UA and stated she was unable to collect a sample, will try again later.

## 2018-10-29 NOTE — ED Notes (Signed)
Patient verbalizes understanding of discharge instructions. Opportunity for questioning and answers were provided. Armband removed by staff, pt discharged from ED.  

## 2018-10-30 LAB — SARS CORONAVIRUS 2 (TAT 6-24 HRS): SARS Coronavirus 2: NEGATIVE

## 2018-11-02 ENCOUNTER — Telehealth: Payer: Self-pay | Admitting: *Deleted

## 2018-11-02 NOTE — Telephone Encounter (Signed)
Patient called to obtain her COVID 19 test results from test on 10/29/2018.  Patient notified of negative test results.

## 2018-11-12 MED FILL — BENZONATATE 100 MG CAPS: 100 | 7 days supply | Qty: 21 | Fill #0

## 2019-04-01 ENCOUNTER — Ambulatory Visit: Payer: Self-pay | Attending: Internal Medicine

## 2019-04-01 DIAGNOSIS — Z23 Encounter for immunization: Secondary | ICD-10-CM

## 2019-04-01 NOTE — Progress Notes (Signed)
   Covid-19 Vaccination Clinic  Name:  NYARA CAPELL    MRN: [id number] DOB: January 14, 1956  04/01/2019  Ms. Eid was observed post Covid-19 immunization for 15 minutes without incident. She was provided with Vaccine Information Sheet and instruction to access the V-Safe system.   Ms. Bordas was instructed to call 911 with any severe reactions post vaccine: Marland Kitchen Difficulty breathing  . Swelling of face and throat  . A fast heartbeat  . A bad rash all over body  . Dizziness and weakness   Immunizations Administered    Name Date Dose VIS Date Route   Pfizer COVID-19 Vaccine 04/01/2019 11:13 AM 0.3 mL 01/01/2019 Intramuscular   Manufacturer: [employer], [employer]   Lot: [id number]   NDC: [address]-4

## 2019-04-26 ENCOUNTER — Ambulatory Visit: Payer: Self-pay | Attending: Internal Medicine

## 2019-04-26 DIAGNOSIS — Z23 Encounter for immunization: Secondary | ICD-10-CM

## 2019-04-26 NOTE — Progress Notes (Signed)
   Covid-19 Vaccination Clinic  Name:  Rhonda Best    MRN: [id number] DOB: 1955/03/03  04/26/2019  [name] was observed post Covid-19 immunization for 15 minutes without incident. She was provided with Vaccine Information Sheet and instruction to access the V-Safe system.   [name] was instructed to call 911 with any severe reactions post vaccine: Marland Kitchen Difficulty breathing  . Swelling of face and throat  . A fast heartbeat  . A bad rash all over body  . Dizziness and weakness   Immunizations Administered    Name Date Dose VIS Date Route   Pfizer COVID-19 Vaccine 04/26/2019 10:46 AM 0.3 mL 01/01/2019 Intramuscular   Manufacturer: [employer], [employer]   Lot: [id number]   NDC: [address]-6

## 2019-05-26 ENCOUNTER — Other Ambulatory Visit: Payer: Self-pay | Admitting: Internal Medicine

## 2019-05-26 ENCOUNTER — Encounter: Payer: Self-pay | Admitting: Internal Medicine

## 2019-05-26 ENCOUNTER — Telehealth (INDEPENDENT_AMBULATORY_CARE_PROVIDER_SITE_OTHER): Payer: Self-pay | Admitting: Internal Medicine

## 2019-05-26 DIAGNOSIS — I10 Essential (primary) hypertension: Secondary | ICD-10-CM

## 2019-05-26 DIAGNOSIS — F411 Generalized anxiety disorder: Secondary | ICD-10-CM

## 2019-05-26 MED ORDER — AMLODIPINE BESYLATE 10 MG PO TABS: 10.0000 mg | ORAL_TABLET | Freq: Every day | ORAL | 1 refills | Status: DC

## 2019-05-26 MED ORDER — BUSPIRONE HCL 5 MG PO TABS: 5.0000 mg | ORAL_TABLET | Freq: Two times a day (BID) | ORAL | 0 refills | Status: DC

## 2019-05-26 MED FILL — busPIRone HCL 5 MG TABS: 5 | 30 days supply | Qty: 60 | Fill #0

## 2019-05-26 MED FILL — AMLODIPINE BESYLATE 10 MG T: 10 | 30 days supply | Qty: 30 | Fill #0

## 2019-05-26 NOTE — Progress Notes (Signed)
Virtual Visit via Telephone Note  I connected with Rhonda Best, on 05/26/2019 at 2:59 PM by telephone due to the COVID-19 pandemic and verified that I am speaking with the correct person using two identifiers.   Consent: I discussed the limitations, risks, security and privacy concerns of performing an evaluation and management service by telephone and the availability of in person appointments. I also discussed with the patient that there may be a patient responsible charge related to this service. The patient expressed understanding and agreed to proceed.   Location of Patient: Home   Location of Provider: Clinic    Persons participating in Telemedicine visit: Michal Strzelecki [facility] Dr. Earlene Plater      History of Present Illness: Patient has a visit to follow up on HTN. She also requests to restart Buspar. She was previously taking it for anxiety. She requests referral to psych. Reports that she never went when it was placed in the past.   Chronic HTN Disease Monitoring:  Home BP Monitoring - Does not monitor.  Chest pain- no  Dyspnea- no Headache - no  Medications: Amlodipine 10 mg  Compliance- yes Lightheadedness- no  Edema- no  Reports she has been on Amlodipine for quite some time.     Past Medical History:  Diagnosis Date  . Hypertension    No Known Allergies  Current Outpatient Medications on File Prior to Visit  Medication Sig Dispense Refill  . amLODipine (NORVASC) 10 MG tablet Take 1 tablet (10 mg total) by mouth daily. 90 tablet 1   No current facility-administered medications on file prior to visit.    Observations/Objective: NAD. Speaking clearly.  Work of breathing normal.  Alert and oriented. Mood appropriate.   Assessment and Plan: 1. Essential hypertension Does not monitor. No red flag symptoms. Refill Amlodipine.  Counseled on blood pressure goal of less than 130/80, low-sodium, DASH diet, medication  compliance, 150 minutes of moderate intensity exercise per week. Discussed medication compliance, adverse effects. - amLODipine (NORVASC) 10 MG tablet; Take 1 tablet (10 mg total) by mouth daily.  Dispense: 90 tablet; Refill: 1  2. Generalized anxiety disorder - busPIRone (BUSPAR) 5 MG tablet; Take 1 tablet (5 mg total) by mouth 2 (two) times daily.  Dispense: 180 tablet; Refill: 0 - Ambulatory referral to Psychiatry   Follow Up Instructions: Annual exam    I discussed the assessment and treatment plan with the patient. The patient was provided an opportunity to ask questions and all were answered. The patient agreed with the plan and demonstrated an understanding of the instructions.   The patient was advised to call back or seek an in-person evaluation if the symptoms worsen or if the condition fails to improve as anticipated.     I provided 12 minutes total of non-face-to-face time during this encounter including median intraservice time, reviewing previous notes, investigations, ordering medications, medical decision making, coordinating care and patient verbalized understanding at the end of the visit.    Marcy Siren, D.O. Primary Care at [facility]  05/26/2019, 2:59 PM

## 2019-05-27 ENCOUNTER — Emergency Department (HOSPITAL_COMMUNITY)
Admission: EM | Admit: 2019-05-27 | Discharge: 2019-05-27 | Disposition: A | Discharge status: Home / Self Care | Payer: Self-pay | Attending: Emergency Medicine | Admitting: Emergency Medicine

## 2019-05-27 ENCOUNTER — Encounter (HOSPITAL_COMMUNITY): Payer: Self-pay | Admitting: Emergency Medicine

## 2019-05-27 ENCOUNTER — Other Ambulatory Visit: Payer: Self-pay

## 2019-05-27 DIAGNOSIS — F419 Anxiety disorder, unspecified: Secondary | ICD-10-CM | POA: Insufficient documentation

## 2019-05-27 DIAGNOSIS — Z5321 Procedure and treatment not carried out due to patient leaving prior to being seen by health care provider: Secondary | ICD-10-CM | POA: Insufficient documentation

## 2019-05-27 DIAGNOSIS — R5383 Other fatigue: Secondary | ICD-10-CM | POA: Insufficient documentation

## 2019-05-27 NOTE — ED Notes (Signed)
Pt name called 3x to be roomed, no response 

## 2019-05-27 NOTE — ED Triage Notes (Signed)
Pt reports hx of anxiety, supposed to be on meds but states she never started them. Anxiety was really bad yesterday and her kids called EMS but she did not want transport. Pt states that she just "feels tired." denies si or hi. Calm and cooperative in triage.

## 2019-08-17 IMAGING — CR CHEST - 2 VIEW
2 series · 2 of 2 positions shown · non-contrast
Comparison: 10/01/2017

CLINICAL DATA: Shortness of breath, chest pain

EXAM:
CHEST - 2 VIEW

[chest pa]
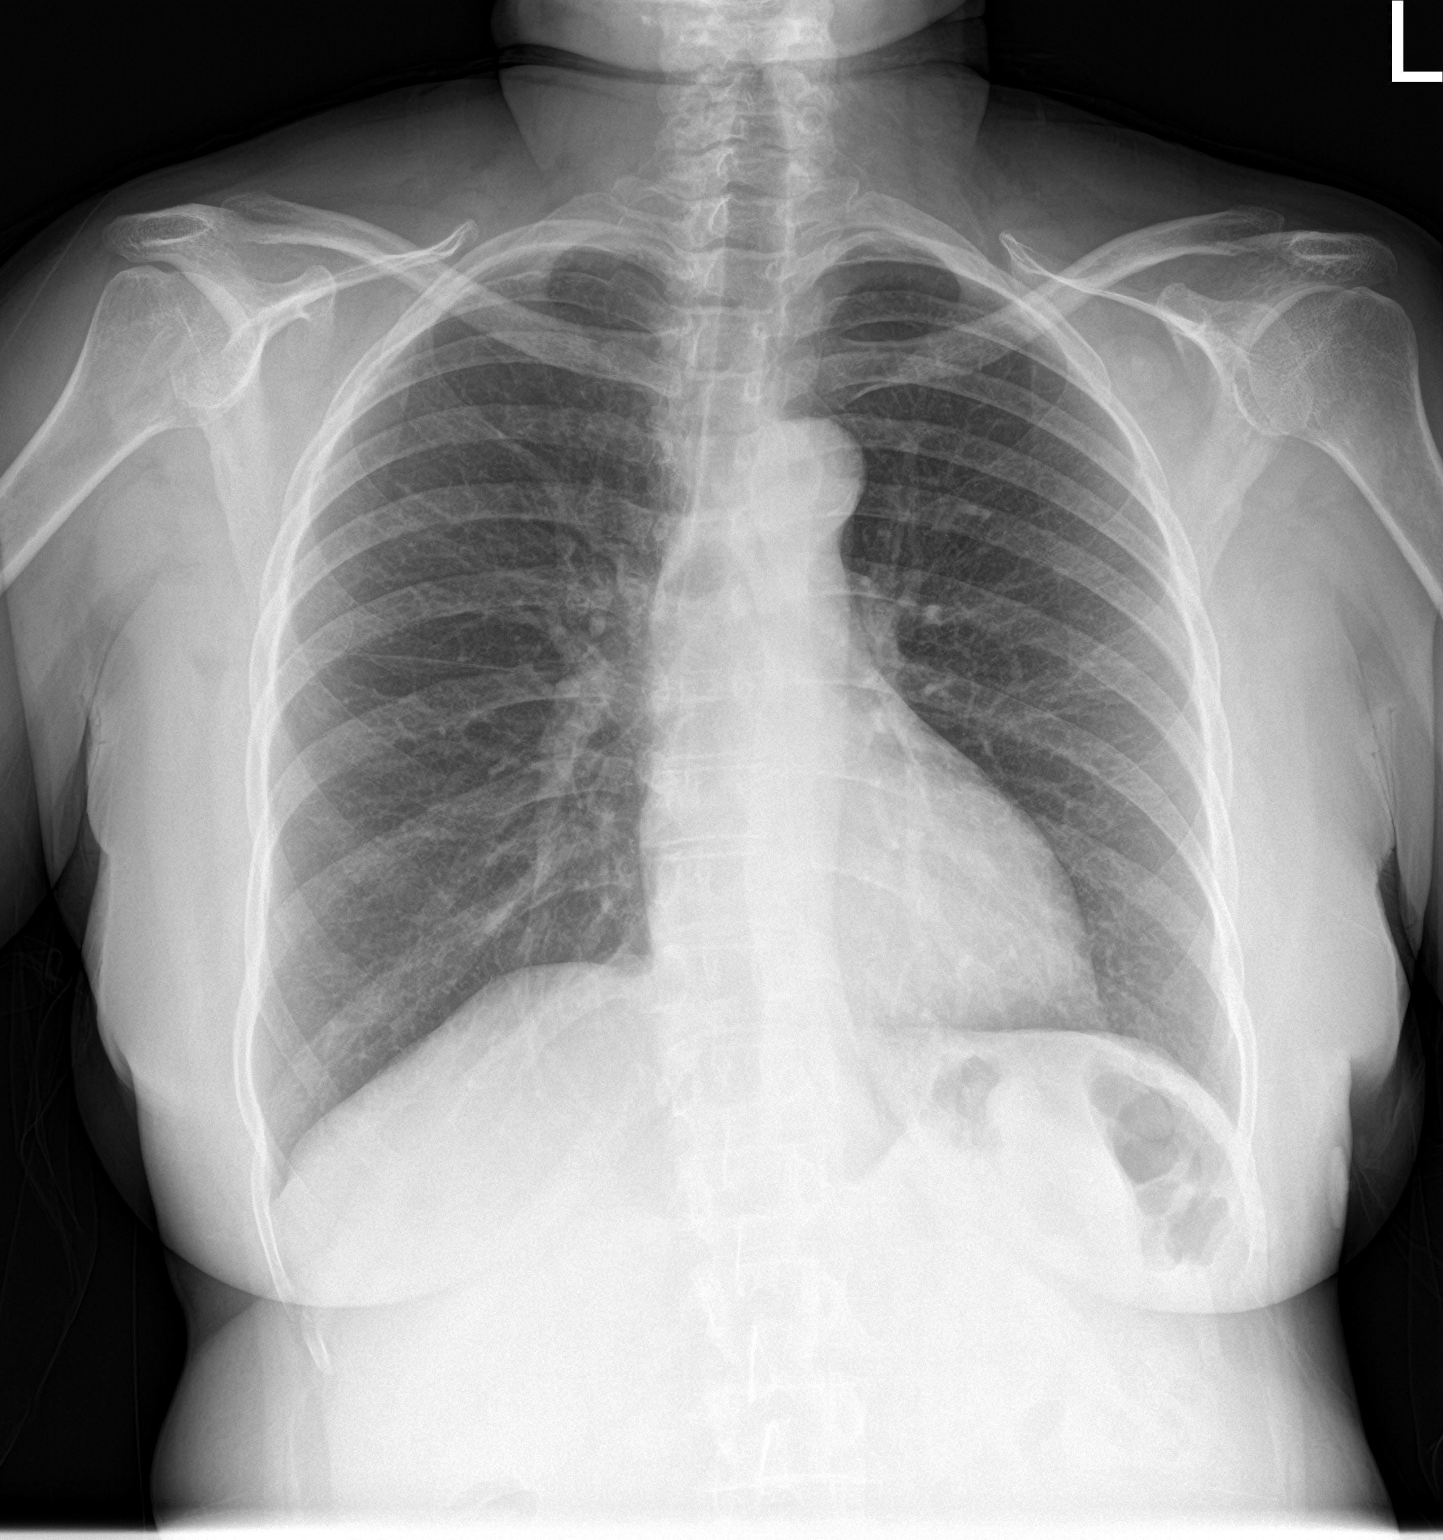

[chest lat]
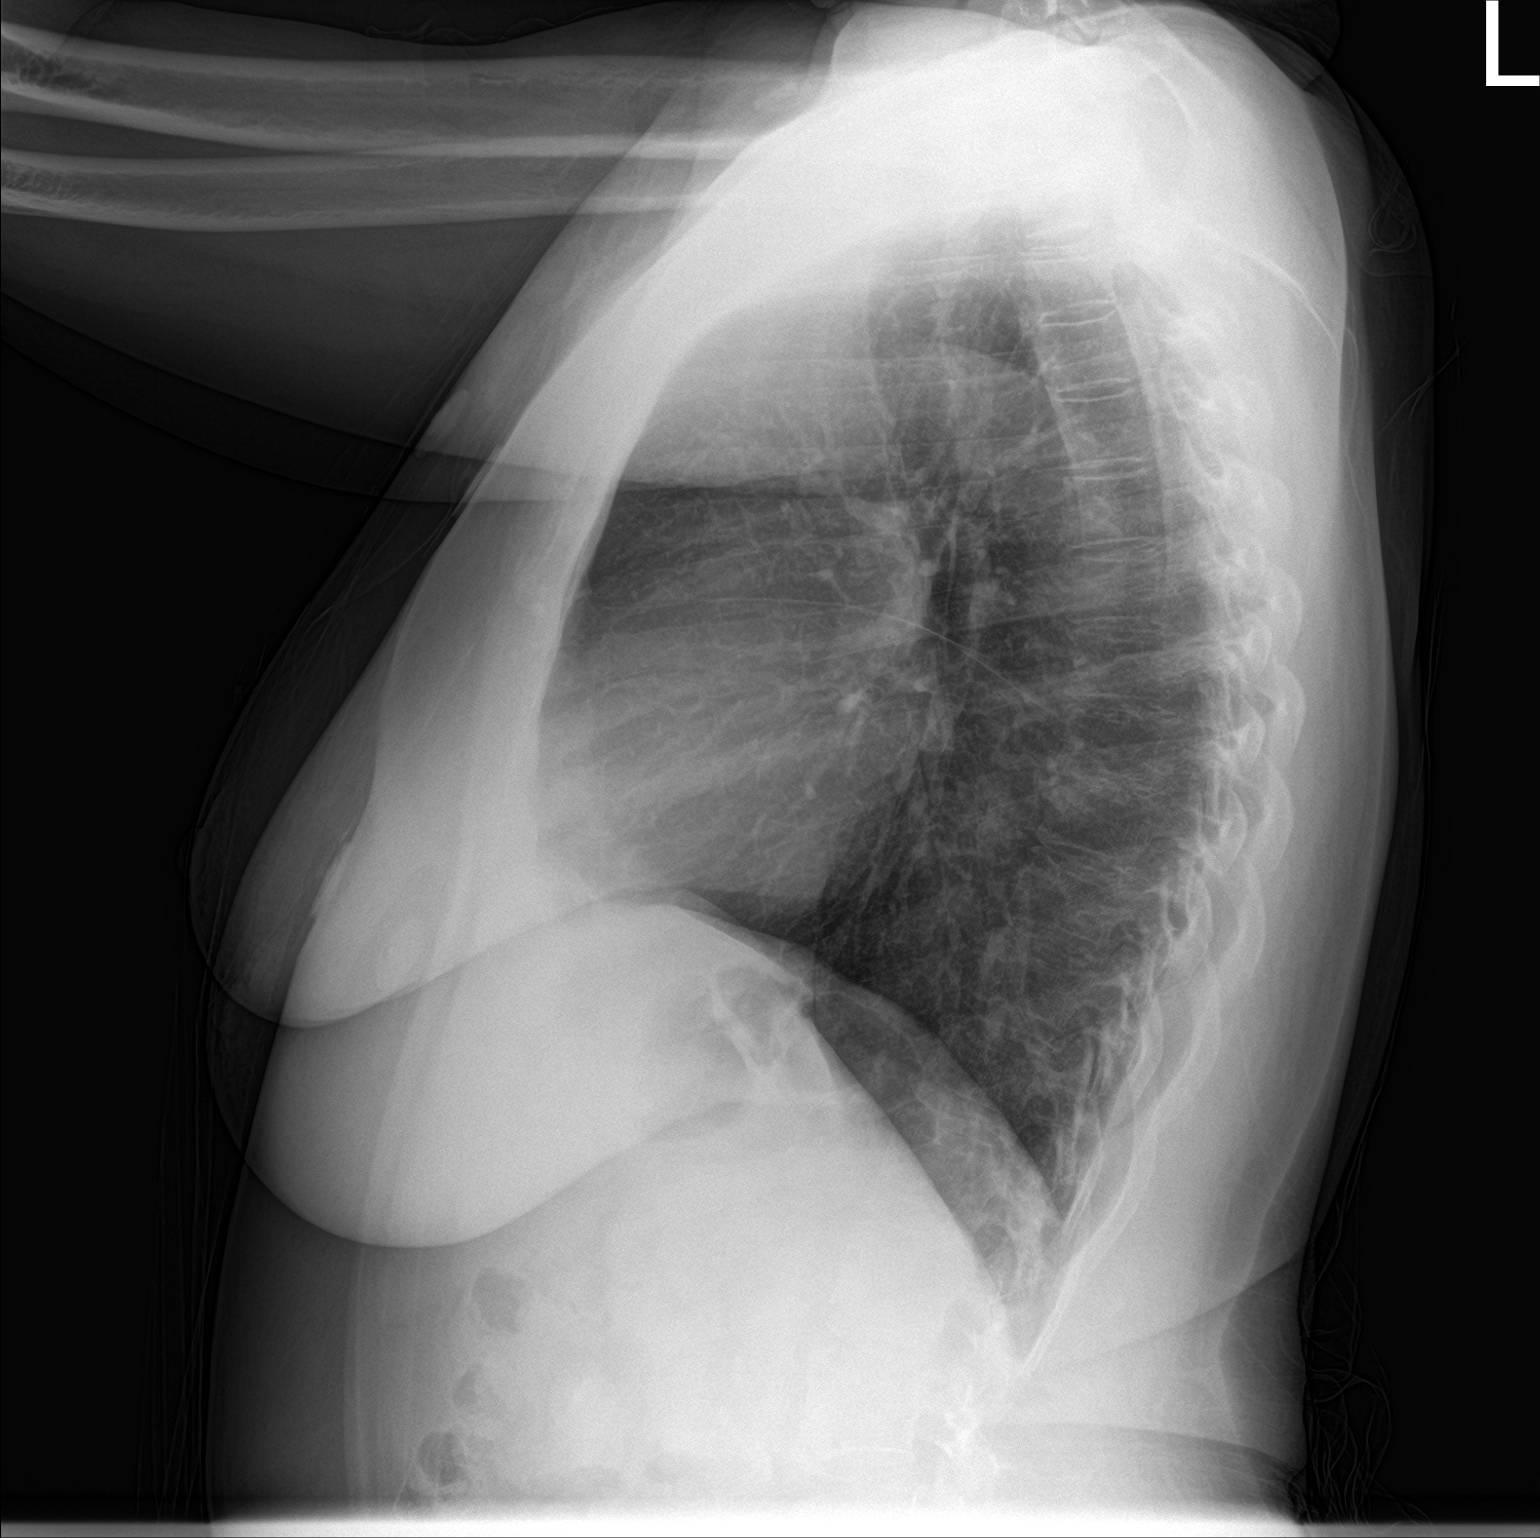

[2 of 2 positions shown; findings below may reference images not displayed]

FINDINGS: Heart and mediastinal contours are within normal limits. No focal
opacities or effusions. No acute bony abnormality.
IMPRESSION: No active cardiopulmonary disease.

## 2019-09-03 IMAGING — DX PORTABLE CHEST - 1 VIEW
1 series · 1 of 1 positions shown · non-contrast
Comparison: [DATE] and earlier.

CLINICAL DATA: [REDACTED] history of shortness of breath with exertion
and productive cough. Ten day history of nausea and vomiting.

EXAM:
PORTABLE CHEST 1 VIEW

[chest]
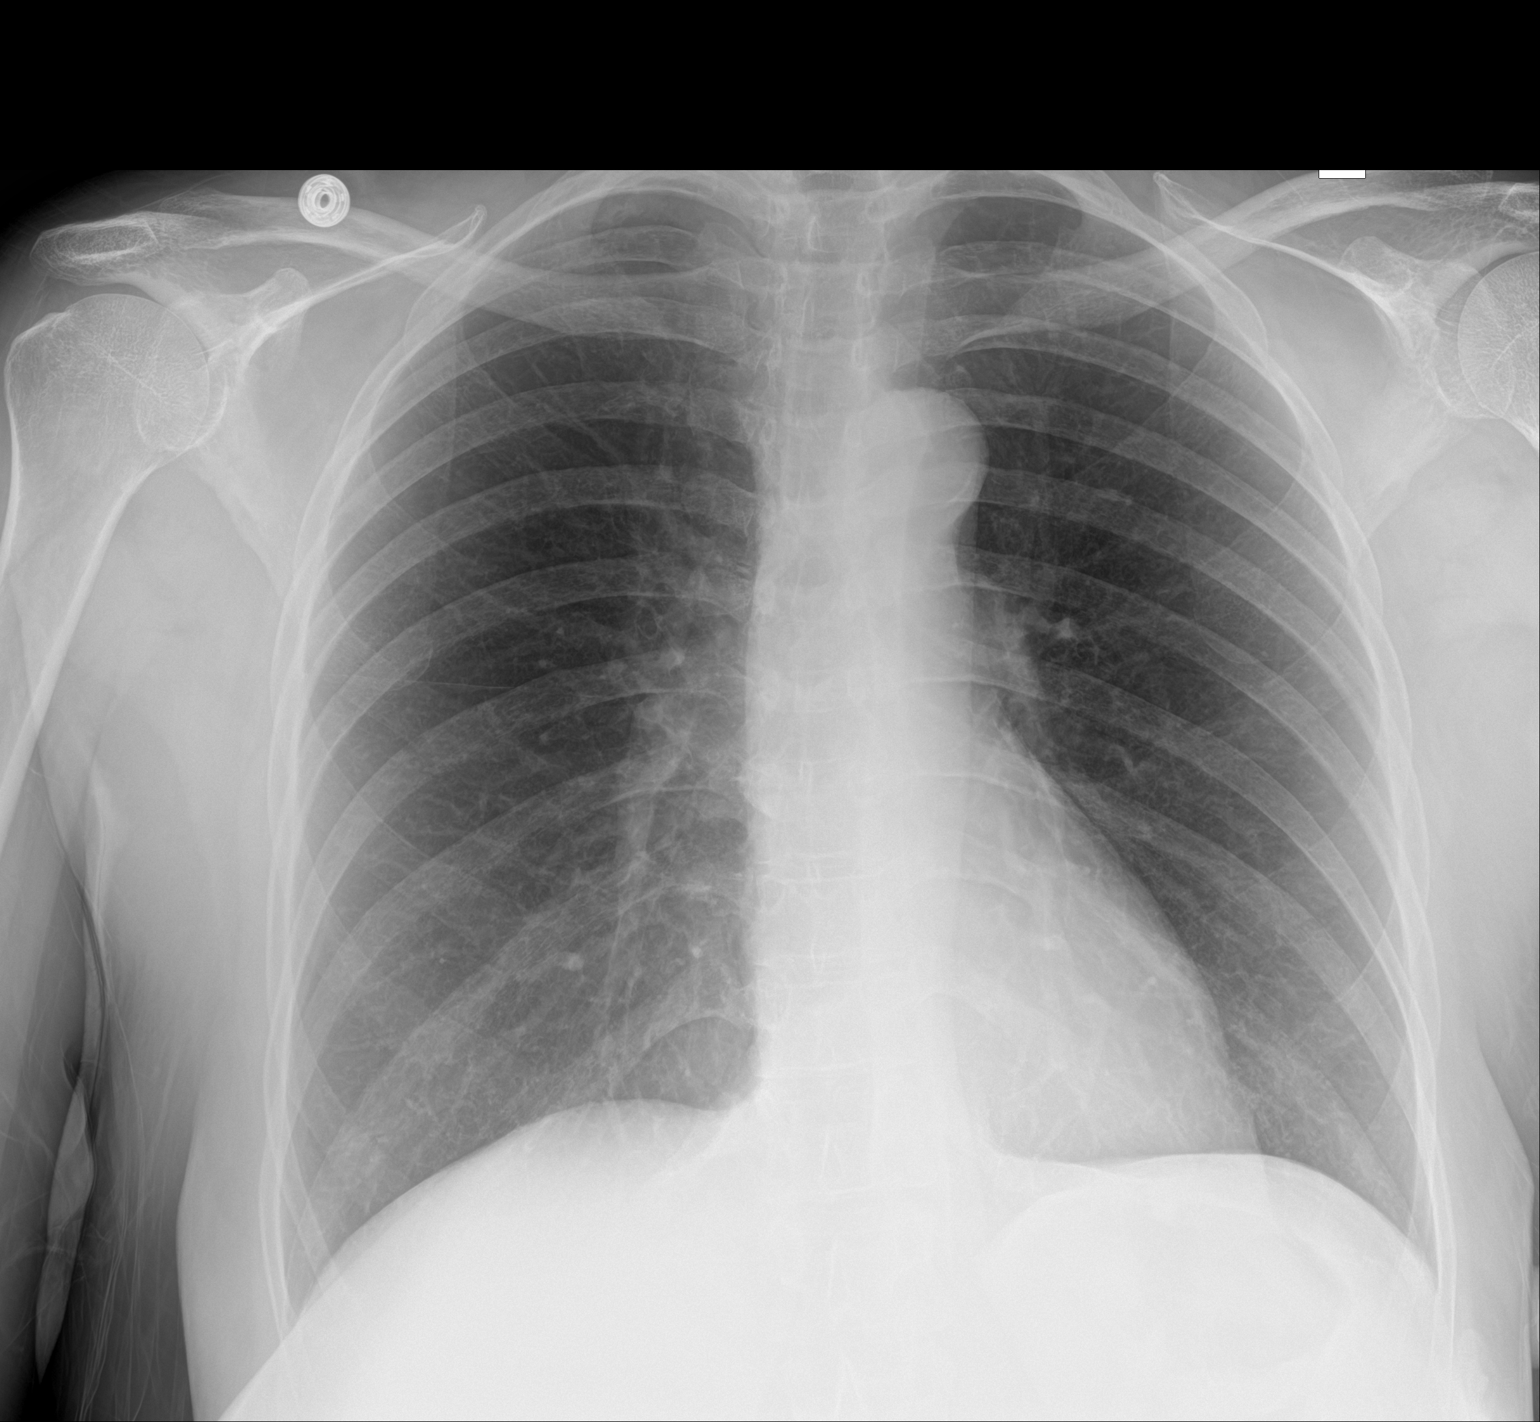

[1 of 1 positions shown; findings below may reference images not displayed]

FINDINGS: Cardiac silhouette normal in size, unchanged. Thoracic aorta mildly
atherosclerotic, unchanged over multiple prior exams. Lungs clear.
Bronchovascular markings normal. Pulmonary vascularity normal. No
visible pleural effusions. No pneumothorax.
IMPRESSION: No acute cardiopulmonary disease.

## 2019-10-07 ENCOUNTER — Other Ambulatory Visit: Payer: Self-pay | Admitting: Internal Medicine

## 2019-10-07 DIAGNOSIS — I10 Essential (primary) hypertension: Secondary | ICD-10-CM

## 2019-10-07 MED ORDER — AMLODIPINE BESYLATE 10 MG PO TABS: 10.0000 mg | ORAL_TABLET | Freq: Every day | ORAL | 0 refills | Status: DC

## 2019-10-07 MED FILL — AMLODIPINE BESYLATE 10 MG T: 10 | 30 days supply | Qty: 30 | Fill #0

## 2019-10-07 NOTE — Telephone Encounter (Signed)
Medication: amLODipine (NORVASC) 10 MG tablet [[id number]]   Has the patient contacted their pharmacy? YES  (Agent: If no, request that the patient contact the pharmacy for the refill.) (Agent: If yes, when and what did the pharmacy advise?)  Preferred Pharmacy (with phone number or street name): [facility] - [place], [place] - [address]  Phone:  [phone] Fax:  [phone]     Agent: Please be advised that RX refills may take up to 3 business days. We ask that you follow-up with your pharmacy.

## 2019-11-22 MED FILL — busPIRone HCL 5 MG TABS: 5 | 30 days supply | Qty: 60 | Fill #1

## 2019-11-22 MED FILL — AMLODIPINE BESYLATE 10 MG T: 10 | 30 days supply | Qty: 30 | Fill #1

## 2019-12-30 MED FILL — AMLODIPINE BESYLATE 10 MG T: 10 | 30 days supply | Qty: 30 | Fill #2

## 2020-05-22 ENCOUNTER — Encounter: Payer: Self-pay | Admitting: Emergency Medicine

## 2020-05-22 ENCOUNTER — Other Ambulatory Visit: Payer: Self-pay

## 2020-05-22 ENCOUNTER — Ambulatory Visit
Admission: EM | Admit: 2020-05-22 | Discharge: 2020-05-22 | Disposition: A | Discharge status: Home / Self Care | Payer: Self-pay | Attending: Emergency Medicine | Admitting: Emergency Medicine

## 2020-05-22 DIAGNOSIS — S40012A Contusion of left shoulder, initial encounter: Secondary | ICD-10-CM

## 2020-05-22 DIAGNOSIS — M546 Pain in thoracic spine: Secondary | ICD-10-CM

## 2020-05-22 MED ORDER — NAPROXEN 375 MG PO TABS
375.0000 mg | ORAL_TABLET | Freq: Two times a day (BID) | ORAL | 0 refills | Status: DC
  Filled 2020-05-22: qty 20, 10d supply, fill #0

## 2020-05-22 MED ORDER — TIZANIDINE HCL 2 MG PO TABS
2.0000 mg | ORAL_TABLET | Freq: Four times a day (QID) | ORAL | 0 refills | Status: DC | PRN
  Filled 2020-05-22: qty 30, 4d supply, fill #0
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
## 2020-05-22 NOTE — Discharge Instructions (Addendum)
Ice shoulder Use sling only for comfort, please be sure to take out and gently move your shoulder around Naprosyn twice daily with food for pain and swelling May supplement tizanidine at home/bedtime-this is a muscle relaxer, will cause drowsiness, do not drive or work after taking Please follow-up if not improving or worsening
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
## 2020-05-23 ENCOUNTER — Other Ambulatory Visit: Payer: Self-pay

## 2020-06-10 ENCOUNTER — Encounter (HOSPITAL_COMMUNITY): Payer: Self-pay | Admitting: *Deleted

## 2020-06-10 ENCOUNTER — Ambulatory Visit (HOSPITAL_COMMUNITY)
Admission: EM | Admit: 2020-06-10 | Discharge: 2020-06-10 | Disposition: A | Discharge status: Home / Self Care | Payer: Self-pay | Attending: Internal Medicine | Admitting: Internal Medicine

## 2020-06-10 ENCOUNTER — Other Ambulatory Visit: Payer: Self-pay

## 2020-06-10 DIAGNOSIS — T63464A Toxic effect of venom of wasps, undetermined, initial encounter: Secondary | ICD-10-CM

## 2020-06-10 DIAGNOSIS — R21 Rash and other nonspecific skin eruption: Secondary | ICD-10-CM

## 2020-06-10 DIAGNOSIS — L03114 Cellulitis of left upper limb: Secondary | ICD-10-CM

## 2020-06-10 DIAGNOSIS — T63441A Toxic effect of venom of bees, accidental (unintentional), initial encounter: Secondary | ICD-10-CM

## 2020-06-10 MED ORDER — DEXAMETHASONE SODIUM PHOSPHATE 10 MG/ML IJ SOLN
INTRAMUSCULAR | Status: AC
  Filled 2020-06-10: qty 1

## 2020-06-10 MED ORDER — DEXAMETHASONE SODIUM PHOSPHATE 10 MG/ML IJ SOLN
10.0000 mg | Freq: Once | INTRAMUSCULAR | Status: AC
  Administered 2020-06-10: 10 mg via INTRAMUSCULAR

## 2020-06-10 MED ORDER — METHYLPREDNISOLONE 4 MG PO TABS
ORAL_TABLET | ORAL | 0 refills | Status: DC
  Filled 2020-06-15: qty 21, 6d supply, fill #0

## 2020-06-10 MED ORDER — DOXYCYCLINE HYCLATE 100 MG PO CAPS: 100.0000 mg | ORAL_CAPSULE | Freq: Two times a day (BID) | ORAL | 0 refills | Status: DC

## 2020-06-10 MED ORDER — DOXYCYCLINE HYCLATE 100 MG PO TABS
100.0000 mg | ORAL_TABLET | Freq: Once | ORAL | Status: AC
  Administered 2020-06-10: 100 mg via ORAL

## 2020-06-10 MED ORDER — DOXYCYCLINE HYCLATE 100 MG PO TABS
ORAL_TABLET | ORAL | Status: AC
  Filled 2020-06-10: qty 1

## 2020-06-10 NOTE — ED Triage Notes (Addendum)
Pt stung by wasp on Tuesday and now has swelling to LT forearm. Pt has been taking benadryl 2 times a day since Tuesday . Pt reports swelling is worse.

## 2020-06-10 NOTE — ED Provider Notes (Addendum)
[facility]    CSN: [id number] Arrival date & time: 06/10/20  1426      History   Chief Complaint Chief Complaint  Patient presents with  . Arm Swelling    HPI Rhonda Best is a 65 y.o. female who presents with redness and unresolved swelling of her L forearm since stung by a wasp 4 days ago. Has been taking Benadryl bid but is not getting better.     Past Medical History:  Diagnosis Date  . Hypertension     Patient Active Problem List   Diagnosis Date Noted  . Essential hypertension 11/12/2017  . Current episode of major depressive disorder without prior episode 11/12/2017  . Anxiety 11/12/2017    History reviewed. No pertinent surgical history.  OB History   No obstetric history on file.      Home Medications    Prior to Admission medications   Medication Sig Start Date End Date Taking? Authorizing Provider  doxycycline (VIBRAMYCIN) 100 MG capsule Take 1 capsule (100 mg total) by mouth 2 (two) times daily. 06/10/20  Yes Rodriguez-Southworth, Nettie Elm, PA-C  methylPREDNISolone (MEDROL DOSEPAK) 4 MG TBPK tablet As directed 06/11/20  Yes Rodriguez-Southworth, Nettie Elm, PA-C  amLODipine (NORVASC) 10 MG tablet TAKE 1 TABLET (10 MG TOTAL) BY MOUTH DAILY. 10/07/19 10/06/20  Arvilla Market, DO  naproxen (NAPROSYN) 375 MG tablet Take 1 tablet (375 mg total) by mouth 2 (two) times daily. 05/22/20   Wieters, Hallie C, PA-C  tiZANidine (ZANAFLEX) 2 MG tablet Take 1-2 tablets (2-4 mg total) by mouth every 6 (six) hours as needed for muscle spasms. 05/22/20   Wieters, Junius Creamer, PA-C    Family History Family History  Problem Relation Age of Onset  . Healthy Mother     Social History Social History   Tobacco Use  . Smoking status: Former Smoker    Packs/day: 0.05    Types: Cigarettes  . Smokeless tobacco: Never Used  Vaping Use  . Vaping Use: Never used  Substance Use Topics  . Alcohol use: Yes    Comment: Socially   . Drug use: No      Allergies   Patient has no known allergies.   Review of Systems Review of Systems  Constitutional: Negative for fever.  Skin: Positive for color change and rash. Negative for pallor and wound.       swelling     Physical Exam Triage Vital Signs ED Triage Vitals  Enc Vitals Group     BP 06/10/20 1604 (!) 149/96     Pulse Rate 06/10/20 1604 68     Resp 06/10/20 1604 18     Temp 06/10/20 1604 98 F (36.7 C)     Temp src --      SpO2 06/10/20 1604 98 %     Weight --      Height --      Head Circumference --      Peak Flow --      Pain Score 06/10/20 1600 5     Pain Loc --      Pain Edu? --      Excl. in GC? --    No data found.  Updated Vital Signs BP (!) 149/96   Pulse 68   Temp 98 F (36.7 C)   Resp 18   SpO2 98%   Visual Acuity Right Eye Distance:   Left Eye Distance:   Bilateral Distance:    Right Eye Near:   Left  Eye Near:    Bilateral Near:     Physical Exam Vitals and nursing note reviewed.  Constitutional:      General: She is not in acute distress.    Appearance: She is not toxic-appearing.  HENT:     Head: Normocephalic.     Right Ear: External ear normal.     Left Ear: External ear normal.  Eyes:     General: No scleral icterus.    Conjunctiva/sclera: Conjunctivae normal.  Pulmonary:     Effort: Pulmonary effort is normal.  Chest:  Breasts:     Left: No axillary adenopathy.    Musculoskeletal:        General: Normal range of motion.     Cervical back: Neck supple.  Lymphadenopathy:     Upper Body:     Left upper body: No axillary adenopathy.  Skin:    General: Skin is warm and dry.     Capillary Refill: Capillary refill takes less than 2 seconds.     Comments: L FOREARM- site of sting at  ulnar distal wrist area, with warmth, and has a white raised punctate area, but no signs of stinger present. Has erythema and swelling from 2/3 of forearm to mid to pinkie knuckles.   Neurological:     Mental Status: She is alert and  oriented to person, place, and time.     Gait: Gait normal.  Psychiatric:        Mood and Affect: Mood normal.        Behavior: Behavior normal.        Thought Content: Thought content normal.        Judgment: Judgment normal.    UC Treatments / Results  Labs (all labs ordered are listed, but only abnormal results are displayed) Labs Reviewed - No data to display  EKG   Radiology No results found.  Procedures Procedures (including critical care time)  Medications Ordered in UC Medications  dexamethasone (DECADRON) injection 10 mg (has no administration in time range)  doxycycline (VIBRA-TABS) tablet 100 mg (has no administration in time range)    Initial Impression / Assessment and Plan / UC Course  I have reviewed the triage vital signs and the nursing notes. She was given Decadron 10 mg IM, and one dose of Doxy until she can pick up her rx tonight since she has somewhere to be right after this visit.  Allergic reaction to wasp sting, and possible cellulitis .  I placed her on Doxy for 7 days and Medrol dose pack. Advised that if the redness gets worse and advances as a streak towards axilla, she needs to go to ER Final Clinical Impressions(s) / UC Diagnoses   Final diagnoses:  Wasp sting, undetermined intent, initial encounter  Local reaction to bee sting, accidental or unintentional, initial encounter   Discharge Instructions   None    ED Prescriptions    Medication Sig Dispense Auth. Provider   methylPREDNISolone (MEDROL DOSEPAK) 4 MG TBPK tablet As directed 21 tablet Rodriguez-Southworth, Nettie Elm, PA-C   doxycycline (VIBRAMYCIN) 100 MG capsule Take 1 capsule (100 mg total) by mouth 2 (two) times daily. 14 capsule Rodriguez-Southworth, Nettie Elm, PA-C     PDMP not reviewed this encounter.   Garey Ham, PA-C 06/10/20 1626    Rodriguez-Southworth, [place], PA-C 06/10/20 1627

## 2020-06-15 ENCOUNTER — Other Ambulatory Visit: Payer: Self-pay

## 2020-06-15 MED ORDER — DOXYCYCLINE MONOHYDRATE 100 MG PO CAPS
ORAL_CAPSULE | ORAL | 0 refills | Status: DC
  Filled 2020-06-15: qty 14, 7d supply, fill #0

## 2020-06-16 ENCOUNTER — Other Ambulatory Visit: Payer: Self-pay

## 2020-07-03 NOTE — Progress Notes (Signed)
Patient ID: Rhonda Best, female    DOB: 1955-08-24  MRN: [id number]  CC: Leg Pain  Subjective: Rhonda Best is a 65 y.o. female who presents for leg pain.   Her concerns today include:   Visit 06/10/2020 at [facility] per PA note: HPI Rhonda Best is a 65 y.o. female who presents with redness and unresolved swelling of her L forearm since stung by a wasp 4 days ago. Has been taking Benadryl bid but is not getting better.   A&P: I have reviewed the triage vital signs and the nursing notes. She was given Decadron 10 mg IM, and one dose of Doxy until she can pick up her rx tonight since she has somewhere to be right after this visit. Allergic reaction to wasp sting, and possible cellulitis . I placed her on Doxy for 7 days and Medrol dose pack. Advised that if the redness gets worse and advances as a streak towards axilla, she needs to go to ER  07/04/2020: Feeling better from the wasp sting. However, reports she did not take medication prescribed from the Urgent Care because she is not good at remembering to take medications. 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 Endorses standing a lot with diffuse bilateral leg stiffness/leg pain. Onset: 1 week  Location: both legs  Radiation: no Pain Rating: 10/10 Pain Duration: intermittent  Characteristics: sharp  Aggravating Factors: sitting or standing for extended amounts of time Relieving Factors: Naproxen Trauma: no Ambulation difficulty:  Swelling: no Tenderness: no   Warmth: no  Shortness of breath: no  Chest pain: no   Patient Active Problem List   Diagnosis Date Noted   Essential hypertension 11/12/2017   Current episode of major depressive disorder without prior episode 11/12/2017   Anxiety 11/12/2017     Current Outpatient Medications on File Prior to Visit  Medication Sig Dispense Refill   amLODipine (NORVASC) 10 MG tablet TAKE 1 TABLET (10 MG TOTAL) BY  MOUTH DAILY. 90 tablet 2   tiZANidine (ZANAFLEX) 2 MG tablet Take 1-2 tablets (2-4 mg total) by mouth every 6 (six) hours as needed for muscle spasms. 30 tablet 0   doxycycline (MONODOX) 100 MG capsule TAKE 1 TABLET BY MOUTH TWICE DAILY (Patient not taking: Reported on 07/04/2020) 14 capsule 0   doxycycline (VIBRAMYCIN) 100 MG capsule Take 1 capsule (100 mg total) by mouth 2 (two) times daily. (Patient not taking: Reported on 07/04/2020) 14 capsule 0   methylPREDNISolone (MEDROL) 4 MG tablet Take 4 mg by mouth daily. (Patient not taking: Reported on 07/04/2020)     No current facility-administered medications on file prior to visit.    No Known Allergies  Social History   Socioeconomic History   Marital status: Single    Spouse name: Not on file   Number of children: Not on file   Years of education: Not on file   Highest education level: Not on file  Occupational History   Not on file  Tobacco Use   Smoking status: Former    Packs/day: 0.05    Pack years: 0.00    Types: Cigarettes   Smokeless tobacco: Never  Vaping Use   Vaping Use: Never used  Substance and Sexual Activity   Alcohol use: Yes    Comment: Socially    Drug use: No   Sexual activity: Not on file  Other Topics Concern   Not on file  Social History Narrative  Not on file   Social Determinants of Health   Financial Resource Strain: Not on file  Food Insecurity: Not on file  Transportation Needs: Not on file  Physical Activity: Not on file  Stress: Not on file  Social Connections: Not on file  Intimate Partner Violence: Not on file    Family History  Problem Relation Age of Onset   Healthy Mother     No past surgical history on file.  ROS: Review of Systems Negative except as stated above  PHYSICAL EXAM: BP 137/89 (BP Location: Right Arm, Patient Position: Sitting, Cuff Size: Normal)   Pulse 76   Temp 98.4 F (36.9 C)   Resp 15   Ht 5' 3.39" (1.61 m)   Wt 150 lb 6.4 oz (68.2 kg)   SpO2 97%    BMI 26.32 kg/m   Physical Exam General appearance - alert, well appearing, and in no distress and oriented to person, place, and time Mental status - alert, oriented to person, place, and time, normal mood, behavior, speech, dress, motor activity, and thought processes Neck - supple, no significant adenopathy Chest - clear to auscultation, no wheezes, rales or rhonchi, symmetric air entry, no tachypnea, retractions or cyanosis Heart - normal rate, regular rhythm, normal S1, S2, no murmurs, rubs, clicks or gallops Neurological - alert, oriented, normal speech, no focal findings or movement disorder noted, neck supple without rigidity, motor and sensory grossly normal bilaterally, normal muscle tone, no tremors, bilateral lower extremity strength 4/5 Musculoskeletal - no joint tenderness, deformity or swelling, bilateral lower extremities limited range of motion with pain Extremities - peripheral pulses normal, no pedal edema, no clubbing or cyanosis, no edema, redness or tenderness in the calves or thighs   ASSESSMENT AND PLAN: 1. Sciatic nerve pain, left: 2. Sciatic nerve pain, right: - No red flag symptoms present. Discussed these possible symptoms with patient and to seek immediate evaluation at the Emergency Department if occurs. Patient verbalized understanding. - Gabapentin as prescribed. May cause drowsiness. Counseled patient to not consume if operating heavy machinery or driving. Counseled patient to not consume with alcohol or illicit substances. Patient verbalized understanding.  - Follow-up with primary provider in 4 weeks or sooner if needed.  - gabapentin (NEURONTIN) 100 MG capsule; Take 1 capsule (100 mg total) by mouth 3 (three) times daily.  Dispense: 90 capsule; Refill: 0    Patient was given the opportunity to ask questions.  Patient verbalized understanding of the plan and was able to repeat key elements of the plan. Patient was given clear instructions to go to  Emergency Department or return to medical center if symptoms don't improve, worsen, or new problems develop.The patient verbalized understanding.    Requested Prescriptions   Signed Prescriptions Disp Refills   gabapentin (NEURONTIN) 100 MG capsule 90 capsule 0    Sig: Take 1 capsule (100 mg total) by mouth 3 (three) times daily.    Return in about 4 weeks (around 08/01/2020) for Follow-Up sciatic nerve pain.  Rema Fendt, NP

## 2020-07-04 ENCOUNTER — Other Ambulatory Visit: Payer: Self-pay

## 2020-07-04 ENCOUNTER — Ambulatory Visit (INDEPENDENT_AMBULATORY_CARE_PROVIDER_SITE_OTHER): Payer: Self-pay | Admitting: Family

## 2020-07-04 ENCOUNTER — Encounter: Payer: Self-pay | Admitting: Family

## 2020-07-04 VITALS — BP 137/89 | HR 76 | Temp 98.4°F | Resp 15 | Ht 63.39 in | Wt 150.4 lb

## 2020-07-04 DIAGNOSIS — M5432 Sciatica, left side: Secondary | ICD-10-CM

## 2020-07-04 DIAGNOSIS — M5431 Sciatica, right side: Secondary | ICD-10-CM

## 2020-07-04 MED ORDER — GABAPENTIN 100 MG PO CAPS
100.0000 mg | ORAL_CAPSULE | Freq: Three times a day (TID) | ORAL | 0 refills | Status: AC
  Filled 2020-07-04 – 2020-07-17 (×2): qty 90, 30d supply, fill #0

## 2020-07-04 NOTE — Progress Notes (Signed)
Pt presents today for leg pain symptoms started a week ago after being stung by wasp, went to [facility] urgent care where she was administered dexamethasone (DECADRON) injection 10 mg which pt states that she has been feeling bilateral leg weakness, stiffness and pain  Pt did not take the medications prescribed at last UC visit

## 2020-07-11 ENCOUNTER — Other Ambulatory Visit: Payer: Self-pay

## 2020-07-17 ENCOUNTER — Other Ambulatory Visit: Payer: Self-pay

## 2020-07-19 ENCOUNTER — Telehealth: Payer: Self-pay | Admitting: Family

## 2020-07-19 NOTE — Telephone Encounter (Signed)
Pt called in stating she is still having leg pain. Patient is asking if something for her pain can be called in to the Pharmacy?  [facility]  [address], [place] [address]  Phone:  [phone]  Fax:  [phone]  DEA #:  [id number]

## 2020-07-20 ENCOUNTER — Other Ambulatory Visit: Payer: Self-pay | Admitting: Family

## 2020-07-20 ENCOUNTER — Other Ambulatory Visit: Payer: Self-pay

## 2020-07-20 DIAGNOSIS — M5431 Sciatica, right side: Secondary | ICD-10-CM

## 2020-07-20 DIAGNOSIS — M5432 Sciatica, left side: Secondary | ICD-10-CM

## 2020-07-20 MED ORDER — IBUPROFEN 600 MG PO TABS: 600.0000 mg | ORAL_TABLET | Freq: Three times a day (TID) | ORAL | 0 refills | Status: DC | PRN

## 2020-07-20 MED ORDER — IBUPROFEN 600 MG PO TABS
600.0000 mg | ORAL_TABLET | Freq: Three times a day (TID) | ORAL | 0 refills | Status: AC | PRN
  Filled 2020-07-20: qty 30, 10d supply, fill #0

## 2020-07-20 NOTE — Telephone Encounter (Signed)
Ibuprofen prescribed for bilateral sciatic nerve pain.   Continue Gabapentin as prescribed.   A referral to Neurology placed. Their office should call patient within 2 weeks with appointment details.

## 2020-07-25 ENCOUNTER — Other Ambulatory Visit: Payer: Self-pay

## 2020-07-25 NOTE — Telephone Encounter (Signed)
Patient called the office asking about response from her PCP. Informed patient.

## 2020-07-31 NOTE — Progress Notes (Signed)
Patient did not show for appointment.   

## 2020-08-01 ENCOUNTER — Encounter: Payer: Self-pay | Admitting: Family

## 2020-08-03 ENCOUNTER — Other Ambulatory Visit: Payer: Self-pay

## 2020-08-23 ENCOUNTER — Ambulatory Visit: Payer: Self-pay | Admitting: Family

## 2020-09-19 NOTE — Progress Notes (Signed)
Erroneous encounter

## 2020-09-21 ENCOUNTER — Encounter: Payer: Self-pay | Admitting: Family

## 2020-09-21 DIAGNOSIS — M5431 Sciatica, right side: Secondary | ICD-10-CM

## 2020-09-21 DIAGNOSIS — M5432 Sciatica, left side: Secondary | ICD-10-CM

## 2021-04-03 ENCOUNTER — Other Ambulatory Visit: Payer: Self-pay

## 2021-04-03 ENCOUNTER — Encounter: Payer: Self-pay | Admitting: Nurse Practitioner

## 2021-04-03 ENCOUNTER — Ambulatory Visit (INDEPENDENT_AMBULATORY_CARE_PROVIDER_SITE_OTHER): Payer: Self-pay | Admitting: Nurse Practitioner

## 2021-04-03 ENCOUNTER — Telehealth: Payer: Self-pay | Admitting: Family

## 2021-04-03 DIAGNOSIS — R051 Acute cough: Secondary | ICD-10-CM

## 2021-04-03 DIAGNOSIS — J069 Acute upper respiratory infection, unspecified: Secondary | ICD-10-CM

## 2021-04-03 MED ORDER — BENZONATATE 200 MG PO CAPS
200.0000 mg | ORAL_CAPSULE | Freq: Two times a day (BID) | ORAL | 0 refills | Status: DC | PRN
  Filled 2021-04-03: qty 20, 10d supply, fill #0

## 2021-04-03 MED ORDER — AMOXICILLIN-POT CLAVULANATE 875-125 MG PO TABS
1.0000 | ORAL_TABLET | Freq: Two times a day (BID) | ORAL | 0 refills | Status: AC
  Filled 2021-04-03: qty 28, 14d supply, fill #0

## 2021-04-03 NOTE — Addendum Note (Signed)
Addended by: Vertis Kelch on: 04/03/2021 01:46 PM ? ? Modules accepted: Orders ? ?

## 2021-04-03 NOTE — Telephone Encounter (Signed)
Patient called requesting that an RX for cough medicine be called in to the [facility]. She said that she had got some Robitussin at the pharmacy but it was not helping. I asked what her symptoms were and she said cold like symptoms, no taste and chills every now and then. I informed her that I would send the request but she may have to be seen first. Please advise. ?

## 2021-04-03 NOTE — Telephone Encounter (Signed)
Appt scheduled w/Tonya Tanda Rockers  ?

## 2021-04-03 NOTE — Patient Instructions (Signed)
1. Upper respiratory tract infection, unspecified type ? ?- amoxicillin-clavulanate (AUGMENTIN) 875-125 MG tablet; Take 1 tablet by mouth 2 (two) times daily for 14 days.  Dispense: 28 tablet; Refill: 0 ?- benzonatate (TESSALON) 200 MG capsule; Take 1 capsule (200 mg total) by mouth 2 (two) times daily as needed for cough.  Dispense: 20 capsule; Refill: 0 ? ?2. Acute cough ? ?- amoxicillin-clavulanate (AUGMENTIN) 875-125 MG tablet; Take 1 tablet by mouth 2 (two) times daily for 14 days.  Dispense: 28 tablet; Refill: 0 ?- benzonatate (TESSALON) 200 MG capsule; Take 1 capsule (200 mg total) by mouth 2 (two) times daily as needed for cough.  Dispense: 20 capsule; Refill: 0 ? ?Stay well hydrated ? ?Stay active ? ?Deep breathing exercises ? ?May take tylenol or fever or pain ? ?May take mucinex twice daily ? ? ? ?Follow up: ? ?Follow up in 2 weeks or sooner if needed ? ?

## 2021-04-03 NOTE — Telephone Encounter (Signed)
Schedule appointment with any provider in office with availability.

## 2021-04-03 NOTE — Progress Notes (Signed)
Virtual Visit via Telephone Note ? ?I connected with Rhonda Best on 04/03/21 at  2:20 PM EDT by telephone and verified that I am speaking with the correct person using two identifiers. ? ?Location: ?Patient: home ?Provider: office ?  ?I discussed the limitations, risks, security and privacy concerns of performing an evaluation and management service by telephone and the availability of in person appointments. I also discussed with the patient that there may be a patient responsible charge related to this service. The patient expressed understanding and agreed to proceed. ? ? ?History of Present Illness: ? ?Patient presents today through telephone visit for sick visit.  Patient states she has been having symptoms of decreased appetite, cough, chills, loss of taste and smell, head and chest congestion for 6 days now.  She has been taking Robitussin and Alka-Seltzer with minimal relief noted.  We discussed that it is too late for flu or COVID treatment at this point the patient can come in to be swabbed today.  We will go ahead and treat with antibiotic and prescribed cough medicine.  Concern for developing pneumonia with ongoing chest congestion and cough. Denies f/c/s, n/v/d, hemoptysis, PND, chest pain or edema. ? ? ?  ?Observations/Objective: ? ?Vitals with BMI 07/04/2020 07/04/2020 06/10/2020  ?Height - 5' 3.386" -  ?Weight - 150 lbs 6 oz -  ?BMI - 26.32 -  ?Systolic 137 152 469  ?Diastolic 89 88 96  ?Pulse - 76 68  ? ? ? ? ?Assessment and Plan: ? ?1. Upper respiratory tract infection, unspecified type ? ?- amoxicillin-clavulanate (AUGMENTIN) 875-125 MG tablet; Take 1 tablet by mouth 2 (two) times daily for 14 days.  Dispense: 28 tablet; Refill: 0 ?- benzonatate (TESSALON) 200 MG capsule; Take 1 capsule (200 mg total) by mouth 2 (two) times daily as needed for cough.  Dispense: 20 capsule; Refill: 0 ? ?2. Acute cough ? ?- amoxicillin-clavulanate (AUGMENTIN) 875-125 MG tablet; Take 1 tablet by mouth 2 (two)  times daily for 14 days.  Dispense: 28 tablet; Refill: 0 ?- benzonatate (TESSALON) 200 MG capsule; Take 1 capsule (200 mg total) by mouth 2 (two) times daily as needed for cough.  Dispense: 20 capsule; Refill: 0 ? ?Stay well hydrated ? ?Stay active ? ?Deep breathing exercises ? ?May take tylenol or fever or pain ? ?May take mucinex twice daily ? ? ? ?Follow up: ? ?Follow up in 2 weeks or sooner if needed ? ? ?  ?I discussed the assessment and treatment plan with the patient. The patient was provided an opportunity to ask questions and all were answered. The patient agreed with the plan and demonstrated an understanding of the instructions. ?  ?The patient was advised to call back or seek an in-person evaluation if the symptoms worsen or if the condition fails to improve as anticipated. ? ?I provided 22 minutes of non-face-to-face time during this encounter. ? ? ?Ivonne Andrew, NP ? ?

## 2021-04-04 ENCOUNTER — Telehealth: Payer: Self-pay | Admitting: Family

## 2021-04-04 LAB — COVID-19, FLU A+B AND RSV
Influenza A, NAA: NOT DETECTED
Influenza B, NAA: NOT DETECTED
RSV, NAA: NOT DETECTED
SARS-CoV-2, NAA: NOT DETECTED

## 2021-04-04 NOTE — Telephone Encounter (Signed)
Copied from CRM [id number]. Topic: General - Other >> Apr 04, 2021 10:32 AM Marylen Ponto wrote: Reason for CRM: Pt called for lab results. Pt requests call back asap with the results. >> Apr 04, 2021  2:17 PM Jaquita Rector A wrote: Patient called back to inquire of Ms Zonia Kief about her test results asking for a call back today please can be reached at Ph#  [phone] (

## 2021-04-05 ENCOUNTER — Telehealth: Payer: Self-pay

## 2021-04-05 ENCOUNTER — Telehealth: Payer: Self-pay | Admitting: Family

## 2021-04-05 NOTE — Telephone Encounter (Signed)
Contacted pt to go over lab results pt is aware and doesn't have any questions or concerns 

## 2021-04-05 NOTE — Telephone Encounter (Signed)
Copied from CRM [id number]. Topic: General - Other ?>> Apr 05, 2021  9:15 AM Gaetana Michaelis A wrote: ?Reason for CRM: The patient would like to speak with a member of clinical staff when possible ? ?The patient would like to review the results of their recent COVID 19 test  ? ?Please contact further when possible ?

## 2021-07-31 ENCOUNTER — Other Ambulatory Visit: Payer: Self-pay

## 2021-07-31 ENCOUNTER — Ambulatory Visit (HOSPITAL_COMMUNITY)
Admission: EM | Admit: 2021-07-31 | Discharge: 2021-07-31 | Disposition: A | Discharge status: Home / Self Care | Payer: Self-pay | Attending: Student | Admitting: Student

## 2021-07-31 ENCOUNTER — Encounter (HOSPITAL_COMMUNITY): Payer: Self-pay | Admitting: Emergency Medicine

## 2021-07-31 DIAGNOSIS — H00024 Hordeolum internum left upper eyelid: Secondary | ICD-10-CM

## 2021-07-31 MED ORDER — POLYMYXIN B-TRIMETHOPRIM 10000-0.1 UNIT/ML-% OP SOLN
1.0000 [drp] | OPHTHALMIC | 0 refills | Status: AC
  Filled 2021-07-31: qty 10, 34d supply, fill #0

## 2021-07-31 NOTE — Discharge Instructions (Addendum)
-  You have a small infected eyelash follicle.  -Start the antibiotic drops, polytrim 1 drop into the affected eye/s every four hours while awake for 7 days. -You can also try warm compresses or washing the face with gentle soap and water to remove crust in the morning. -Your symptoms should be a lot better in about 1 day, and you'll also no longer be contagious after 1 day on the antibiotic. -If your symptoms are getting worse instead of better, like pain, discharge, itching, vision changes-follow-up with your eye doctor as soon as possible. -Do not wear contact lenses until you have completed treatment (7 days!). Wear glasses only.  -Throw away and replace any eye or face makeup you used while infected.

## 2021-07-31 NOTE — ED Triage Notes (Signed)
Pt is present today with c/o right eye drainage and pain. Pt states she also has nasal congestion. Pt states sx started this morning

## 2021-07-31 NOTE — ED Provider Notes (Signed)
[facility]    CSN: [id number] Arrival date & time: 07/31/21  2800      History   Chief Complaint Chief Complaint  Patient presents with   Eye Problem   Nasal Congestion   Eye Drainage    HPI Rhonda Best is a 66 y.o. female presenting with L eye drainage and discomfort x1 day. History hypertension. States exposure to granddaughter who was recently sick - possibly pinkeye. States her L upper inner lid is uncomfortable and it feels like there's something in the eye when she blinks. States she woke up like this - no trauma to the eye. Does not wear contacts, sometimes wears reading glasses.  Denies photophobia, eye redness, eye pain, eye pain with movement, injury to eye, vision changes, double vision, excessive tearing, burning eyes   HPI  Past Medical History:  Diagnosis Date   Hypertension     Patient Active Problem List   Diagnosis Date Noted   Essential hypertension 11/12/2017   Current episode of major depressive disorder without prior episode 11/12/2017   Anxiety 11/12/2017    History reviewed. No pertinent surgical history.  OB History   No obstetric history on file.      Home Medications    Prior to Admission medications   Medication Sig Start Date End Date Taking? Authorizing Provider  trimethoprim-polymyxin b (POLYTRIM) ophthalmic solution Place 1 drop into the left eye every 4 (four) hours for 7 days. 07/31/21 09/03/21 Yes Rhys Martini, PA-C  amLODipine (NORVASC) 10 MG tablet TAKE 1 TABLET (10 MG TOTAL) BY MOUTH DAILY. 10/07/19 10/06/20  Arvilla Market, MD  benzonatate (TESSALON) 200 MG capsule Take 1 capsule (200 mg total) by mouth 2 (two) times daily as needed for cough. 04/03/21   Ivonne Andrew, NP  gabapentin (NEURONTIN) 100 MG capsule Take 1 capsule (100 mg total) by mouth 3 (three) times daily. 07/04/20 08/16/20  Rema Fendt, NP  ibuprofen (ADVIL) 600 MG tablet Take 1 tablet (600 mg total) by mouth every 8 (eight) hours  as needed. 07/20/20   Rema Fendt, NP  methylPREDNISolone (MEDROL) 4 MG tablet Take 4 mg by mouth daily. Patient not taking: Reported on 07/04/2020    [provider]  tiZANidine (ZANAFLEX) 2 MG tablet Take 1-2 tablets (2-4 mg total) by mouth every 6 (six) hours as needed for muscle spasms. 05/22/20   Wieters, Junius Creamer, PA-C    Family History Family History  Problem Relation Age of Onset   Healthy Mother     Social History Social History   Tobacco Use   Smoking status: Former    Packs/day: 0.05    Types: Cigarettes   Smokeless tobacco: Never  Vaping Use   Vaping Use: Never used  Substance Use Topics   Alcohol use: Yes    Comment: Socially    Drug use: No     Allergies   Patient has no known allergies.   Review of Systems Review of Systems  Eyes:  Negative for photophobia, pain, discharge, redness, itching and visual disturbance.  All other systems reviewed and are negative.    Physical Exam Triage Vital Signs ED Triage Vitals  Enc Vitals Group     BP 07/31/21 1051 130/80     Pulse Rate 07/31/21 1051 85     Resp 07/31/21 1051 18     Temp 07/31/21 1051 97.8 F (36.6 C)     Temp src --      SpO2 07/31/21 1051  100 %     Weight --      Height --      Head Circumference --      Peak Flow --      Pain Score 07/31/21 1050 5     Pain Loc --      Pain Edu? --      Excl. in GC? --    No data found.  Updated Vital Signs BP 130/80   Pulse 85   Temp 97.8 F (36.6 C)   Resp 18   SpO2 100%   Visual Acuity Right Eye Distance:   Left Eye Distance:   Bilateral Distance:    Right Eye Near:   Left Eye Near:    Bilateral Near:     Physical Exam Vitals reviewed.  Constitutional:      Appearance: Normal appearance.  HENT:     Head: Normocephalic and atraumatic.     Right Ear: Tympanic membrane, ear canal and external ear normal. There is no impacted cerumen.     Left Ear: Tympanic membrane, ear canal and external ear normal. There is no impacted  cerumen.     Nose: Nose normal. No congestion.     Mouth/Throat:     Pharynx: Oropharynx is clear. No posterior oropharyngeal erythema.  Eyes:     General: Lids are normal. Lids are everted, no foreign bodies appreciated. Vision grossly intact. Gaze aligned appropriately. No visual field deficit.       Right eye: No foreign body, discharge or hordeolum.        Left eye: Hordeolum present.No foreign body or discharge.     Extraocular Movements: Extraocular movements intact.     Right eye: Normal extraocular motion and no nystagmus.     Left eye: Normal extraocular motion and no nystagmus.     Conjunctiva/sclera: Conjunctivae normal.     Right eye: Right conjunctiva is not injected. No chemosis, exudate or hemorrhage.    Left eye: Left conjunctiva is not injected. No chemosis, exudate or hemorrhage.    Pupils: Pupils are equal, round, and reactive to light.     Visual Fields: Right eye visual fields normal and left eye visual fields normal.     Comments: L upper inner lid is very mildly swollen along lashline. No conjunctival changes. PERRLA, EOMI without pain. No orbital tenderness. Visual acuity grossly intact.   Cardiovascular:     Rate and Rhythm: Normal rate and regular rhythm.     Heart sounds: Normal heart sounds.  Pulmonary:     Effort: Pulmonary effort is normal.     Breath sounds: Normal breath sounds.  Neurological:     General: No focal deficit present.     Mental Status: She is alert.  Psychiatric:        Mood and Affect: Mood normal.        Behavior: Behavior normal.        Thought Content: Thought content normal.        Judgment: Judgment normal.      UC Treatments / Results  Labs (all labs ordered are listed, but only abnormal results are displayed) Labs Reviewed - No data to display  EKG   Radiology No results found.  Procedures Procedures (including critical care time)  Medications Ordered in UC Medications - No data to display  Initial Impression /  Assessment and Plan / UC Course  I have reviewed the triage vital signs and the nursing notes.  Pertinent labs & imaging results  that were available during my care of the patient were reviewed by me and considered in my medical decision making (see chart for details).     This patient is a very pleasant 65 y.o. year old female presenting with L upper lid hordeolum. Visual acuity grossly intact. Does not wear contact lenses. Given exposure to pinkeye will proceed with polytrim drops to cover for conjunctivitis and hordeolum, the patient is in agreement. ED return precautions discussed. Patient verbalizes understanding and agreement.   Final Clinical Impressions(s) / UC Diagnoses   Final diagnoses:  Hordeolum internum left upper eyelid     Discharge Instructions      -You have a small infected eyelash follicle.  -Start the antibiotic drops, polytrim 1 drop into the affected eye/s every four hours while awake for 7 days. -You can also try warm compresses or washing the face with gentle soap and water to remove crust in the morning. -Your symptoms should be a lot better in about 1 day, and you'll also no longer be contagious after 1 day on the antibiotic. -If your symptoms are getting worse instead of better, like pain, discharge, itching, vision changes-follow-up with your eye doctor as soon as possible. -Do not wear contact lenses until you have completed treatment (7 days!). Wear glasses only.  -Throw away and replace any eye or face makeup you used while infected.    ED Prescriptions     Medication Sig Dispense Auth. Provider   trimethoprim-polymyxin b (POLYTRIM) ophthalmic solution Place 1 drop into the left eye every 4 (four) hours for 7 days. 10 mL Rhys Martini, PA-C      PDMP not reviewed this encounter.   Rhys Martini, PA-C 07/31/21 1206

## 2021-08-23 ENCOUNTER — Encounter (HOSPITAL_COMMUNITY): Payer: Self-pay | Admitting: *Deleted

## 2021-08-23 ENCOUNTER — Other Ambulatory Visit: Payer: Self-pay

## 2021-08-23 ENCOUNTER — Ambulatory Visit (HOSPITAL_COMMUNITY)
Admission: EM | Admit: 2021-08-23 | Discharge: 2021-08-23 | Disposition: A | Discharge status: Home / Self Care | Payer: Self-pay | Attending: Sports Medicine | Admitting: Sports Medicine

## 2021-08-23 DIAGNOSIS — S50861A Insect bite (nonvenomous) of right forearm, initial encounter: Secondary | ICD-10-CM

## 2021-08-23 DIAGNOSIS — W57XXXA Bitten or stung by nonvenomous insect and other nonvenomous arthropods, initial encounter: Secondary | ICD-10-CM

## 2021-08-23 DIAGNOSIS — L03113 Cellulitis of right upper limb: Secondary | ICD-10-CM

## 2021-08-23 MED ORDER — METHYLPREDNISOLONE SODIUM SUCC 125 MG IJ SOLR
60.0000 mg | Freq: Once | INTRAMUSCULAR | Status: AC
  Administered 2021-08-23: 60 mg via INTRAMUSCULAR

## 2021-08-23 MED ORDER — DOXYCYCLINE HYCLATE 100 MG PO TABS
100.0000 mg | ORAL_TABLET | Freq: Two times a day (BID) | ORAL | 0 refills | Status: AC
  Filled 2021-08-23: qty 10, 5d supply, fill #0

## 2021-08-23 MED ORDER — METHYLPREDNISOLONE SODIUM SUCC 125 MG IJ SOLR
INTRAMUSCULAR | Status: AC
  Filled 2021-08-23: qty 2

## 2021-08-23 NOTE — ED Triage Notes (Signed)
Reports being stung by a bee last night. Pt presents with swelling to Rt hand and lower arm.

## 2021-08-23 NOTE — ED Notes (Signed)
PT requesting a epi -pen

## 2021-08-23 NOTE — ED Provider Notes (Signed)
[facility]    CSN: [id number] Arrival date & time: 08/23/21  0901      History   Chief Complaint Chief Complaint  Patient presents with   Insect Bite    HPI Rhonda Best is a 66 y.o. female.  Who presents today with chief complaint of right arm pain.  She states that she was stung by a bee on Tuesday and then subsequently had some pain swelling and itching.  She did take Benadryl last night and this morning.  She is unsure of the dose.  She denies any drainage from the area or discomfort when moving her wrist or thumb.  She states she has had something like this in the past that got better with some steroids.  She denies any shortness of breath, swelling of her tongue, lips, nausea or scratchy throat.   Past Medical History:  Diagnosis Date   Hypertension     Patient Active Problem List   Diagnosis Date Noted   Essential hypertension 11/12/2017   Current episode of major depressive disorder without prior episode 11/12/2017   Anxiety 11/12/2017    History reviewed. No pertinent surgical history.  OB History   No obstetric history on file.      Home Medications    Prior to Admission medications   Medication Sig Start Date End Date Taking? Authorizing Provider  doxycycline (VIBRAMYCIN) 100 MG capsule Take 1 capsule (100 mg total) by mouth 2 (two) times daily for 5 days. 08/23/21 08/28/21 Yes Claudie Leach, MD  amLODipine (NORVASC) 10 MG tablet TAKE 1 TABLET (10 MG TOTAL) BY MOUTH DAILY. 10/07/19 10/06/20  Arvilla Market, MD  benzonatate (TESSALON) 200 MG capsule Take 1 capsule (200 mg total) by mouth 2 (two) times daily as needed for cough. 04/03/21   Ivonne Andrew, NP  gabapentin (NEURONTIN) 100 MG capsule Take 1 capsule (100 mg total) by mouth 3 (three) times daily. 07/04/20 08/16/20  Rema Fendt, NP  ibuprofen (ADVIL) 600 MG tablet Take 1 tablet (600 mg total) by mouth every 8 (eight) hours as needed. 07/20/20   Rema Fendt, NP   tiZANidine (ZANAFLEX) 2 MG tablet Take 1-2 tablets (2-4 mg total) by mouth every 6 (six) hours as needed for muscle spasms. 05/22/20   Wieters, Hallie C, PA-C  trimethoprim-polymyxin b (POLYTRIM) ophthalmic solution Place 1 drop into the left eye every 4 (four) hours for 7 days. 07/31/21 09/03/21  Rhys Martini, PA-C    Family History Family History  Problem Relation Age of Onset   Healthy Mother     Social History Social History   Tobacco Use   Smoking status: Former    Packs/day: 0.05    Types: Cigarettes   Smokeless tobacco: Never  Vaping Use   Vaping Use: Never used  Substance Use Topics   Alcohol use: Yes    Comment: Socially    Drug use: No     Allergies   Patient has no known allergies.   Review of Systems Review of Systems As listed above in HPI  Physical Exam Triage Vital Signs ED Triage Vitals  Enc Vitals Group     BP 08/23/21 0914 (!) 147/89     Pulse Rate 08/23/21 0914 69     Resp 08/23/21 0914 18     Temp 08/23/21 0914 99 F (37.2 C)     Temp src --      SpO2 08/23/21 0914 100 %     Weight --  Height --      Head Circumference --      Peak Flow --      Pain Score 08/23/21 0911 10     Pain Loc --      Pain Edu? --      Excl. in GC? --    No data found.  Updated Vital Signs BP (!) 147/89   Pulse 69   Temp 99 F (37.2 C)   Resp 18   SpO2 100%    Physical Exam Vitals reviewed.  Constitutional:      General: She is not in acute distress.    Appearance: Normal appearance. She is not toxic-appearing or diaphoretic.  Cardiovascular:     Rate and Rhythm: Normal rate.  Pulmonary:     Effort: Pulmonary effort is normal.  Skin:    Comments: Right forearm: No obvious deformity, some swelling and erythema over the distal forearm.  Small punctate/blister lesion in the distal forearm.  Compartments soft.  No pain with range of motion activities at the wrist.  Neurovascularly intact.  Distal dorsalis pedis 2+  Neurological:     Mental  Status: She is alert.      UC Treatments / Results  Labs (all labs ordered are listed, but only abnormal results are displayed) Labs Reviewed - No data to display  EKG   Radiology No results found.  Procedures Procedures (including critical care time)  Medications Ordered in UC Medications  methylPREDNISolone sodium succinate (SOLU-MEDROL) 125 mg/2 mL injection 60 mg (has no administration in time range)    Initial Impression / Assessment and Plan / UC Course  I have reviewed the triage vital signs and the nursing notes.  Pertinent labs & imaging results that were available during my care of the patient were reviewed by me and considered in my medical decision making (see chart for details).     She is having a localized reaction from an insect sting with possible superimposed bacterial infection.  Patient to continue Benadryl this evening and tomorrow as needed.  She received 60 mg of the prednisone IM today.  Doxycycline sent today to her pharmacy for 5-day course to cover risk for superimposed bacterial infection.  Discussed with patient return to ER if symptoms do not improve or if she has any shortness of breath, swelling of her lips or tongue.  We also discussed following up with a primary care provider.  She verbalized understanding. Final Clinical Impressions(s) / UC Diagnoses   Final diagnoses:  None   Discharge Instructions   None    ED Prescriptions     Medication Sig Dispense Auth. Provider   doxycycline (VIBRAMYCIN) 100 MG capsule Take 1 capsule (100 mg total) by mouth 2 (two) times daily for 5 days. 10 capsule Claudie Leach, MD      PDMP not reviewed this encounter.   Claudie Leach, MD 08/23/21 [phone]

## 2021-08-23 NOTE — Discharge Instructions (Signed)
Continue with Benadryl 50 mg this evening and twice a day tomorrow if you continue to have erythema and swelling.  Start taking and complete your antibiotic course doxycycline, 5 days, twice a day.  If your symptoms worsen or do not improve return to closest urgent care or ER.

## 2021-11-08 ENCOUNTER — Other Ambulatory Visit (HOSPITAL_COMMUNITY): Payer: Self-pay

## 2021-11-08 MED ORDER — MIRTAZAPINE 7.5 MG PO TABS
7.5000 mg | ORAL_TABLET | Freq: Every evening | ORAL | 3 refills | Status: AC
  Filled 2021-11-08: qty 90, 90d supply, fill #0

## 2021-11-09 ENCOUNTER — Other Ambulatory Visit (HOSPITAL_COMMUNITY): Payer: Self-pay

## 2021-11-09 MED ORDER — VITAMIN D (ERGOCALCIFEROL) 1.25 MG (50000 UNIT) PO CAPS
50000.0000 [IU] | ORAL_CAPSULE | ORAL | 0 refills | Status: AC
  Filled 2021-11-09: qty 13, 90d supply, fill #0

## 2021-11-20 ENCOUNTER — Other Ambulatory Visit (HOSPITAL_COMMUNITY): Payer: Self-pay

## 2022-12-23 ENCOUNTER — Encounter (HOSPITAL_COMMUNITY): Payer: Self-pay

## 2022-12-23 ENCOUNTER — Other Ambulatory Visit: Payer: Self-pay

## 2022-12-23 ENCOUNTER — Ambulatory Visit (INDEPENDENT_AMBULATORY_CARE_PROVIDER_SITE_OTHER): Payer: Medicare Other

## 2022-12-23 ENCOUNTER — Ambulatory Visit (HOSPITAL_COMMUNITY)
Admission: EM | Admit: 2022-12-23 | Discharge: 2022-12-23 | Disposition: A | Discharge status: Home / Self Care | Payer: Medicare Other | Attending: Family Medicine | Admitting: Family Medicine

## 2022-12-23 DIAGNOSIS — M25511 Pain in right shoulder: Secondary | ICD-10-CM | POA: Diagnosis not present

## 2022-12-23 DIAGNOSIS — W19XXXA Unspecified fall, initial encounter: Secondary | ICD-10-CM

## 2022-12-23 MED ORDER — KETOROLAC TROMETHAMINE 30 MG/ML IJ SOLN
15.0000 mg | Freq: Once | INTRAMUSCULAR | Status: AC
  Administered 2022-12-23: 15 mg via INTRAMUSCULAR

## 2022-12-23 MED ORDER — CYCLOBENZAPRINE HCL 5 MG PO TABS
10.0000 mg | ORAL_TABLET | Freq: Every evening | ORAL | 0 refills | Status: DC | PRN
  Filled 2022-12-23: qty 20, 10d supply, fill #0

## 2022-12-23 MED ORDER — KETOROLAC TROMETHAMINE 30 MG/ML IJ SOLN
INTRAMUSCULAR | Status: AC
  Filled 2022-12-23: qty 1

## 2022-12-23 MED ORDER — NAPROXEN 375 MG PO TABS
375.0000 mg | ORAL_TABLET | Freq: Two times a day (BID) | ORAL | 0 refills | Status: AC
  Filled 2022-12-23: qty 12, 6d supply, fill #0

## 2022-12-23 NOTE — ED Provider Notes (Signed)
[facility]    CSN: [id number] Arrival date & time: 12/23/22  7829      History   Chief Complaint Chief Complaint  Patient presents with   Fall    HPI Rhonda Best is a 67 y.o. female.  Patient presents today for evaluation of her right shoulder after sustaining a fall in which she fell backwards landing on her right shoulder.  On today patient reports pain is more significant.  She is also having some right hand swelling but denies any pain.  She reports falling at home and did not fall on any object.  She reports minimal pain after fall pain more pronounced when awakening this morning.  She maintains full range of motion reports pain comes in waves or loss of movement.  Denies any prior injury involving the right shoulder. Past Medical History:  Diagnosis Date   Hypertension     Patient Active Problem List   Diagnosis Date Noted   Essential hypertension 11/12/2017   Current episode of major depressive disorder without prior episode 11/12/2017   Anxiety 11/12/2017    History reviewed. No pertinent surgical history.  OB History   No obstetric history on file.      Home Medications    Prior to Admission medications   Medication Sig Start Date End Date Taking? Authorizing Provider  cyclobenzaprine (FLEXERIL) 5 MG tablet Take 2 tablets (10 mg total) by mouth at bedtime as needed for muscle spasms. 12/23/22  Yes Bing Neighbors, NP  naproxen (NAPROSYN) 375 MG tablet Take 1 tablet (375 mg total) by mouth 2 (two) times daily. 12/23/22  Yes Bing Neighbors, NP  amLODipine (NORVASC) 10 MG tablet TAKE 1 TABLET (10 MG TOTAL) BY MOUTH DAILY. 10/07/19 10/06/20  Arvilla Market, MD  benzonatate (TESSALON) 200 MG capsule Take 1 capsule (200 mg total) by mouth 2 (two) times daily as needed for cough. 04/03/21   Ivonne Andrew, NP  gabapentin (NEURONTIN) 100 MG capsule Take 1 capsule (100 mg total) by mouth 3 (three) times daily. 07/04/20 08/16/20  Rema Fendt, NP  ibuprofen (ADVIL) 600 MG tablet Take 1 tablet (600 mg total) by mouth every 8 (eight) hours as needed. 07/20/20   Rema Fendt, NP  mirtazapine (REMERON) 7.5 MG tablet Take 1 tablet (7.5 mg total) by mouth at bedtime for appetite, anxiety/depression and sleep 11/08/21     tiZANidine (ZANAFLEX) 2 MG tablet Take 1-2 tablets (2-4 mg total) by mouth every 6 (six) hours as needed for muscle spasms. 05/22/20   Wieters, Hallie C, PA-C  Vitamin D, Ergocalciferol, (DRISDOL) 1.25 MG (50000 UNIT) CAPS capsule Take 1 capsule (50,000 Units total) by mouth once a week. 11/09/21       Family History Family History  Problem Relation Age of Onset   Healthy Mother     Social History Social History   Tobacco Use   Smoking status: Every Day    Current packs/day: 0.05    Types: Cigarettes   Smokeless tobacco: Never  Vaping Use   Vaping status: Never Used  Substance Use Topics   Alcohol use: Yes    Comment: Socially    Drug use: No     Allergies   Patient has no known allergies.  Review of Systems Review of Systems Pertinent negatives listed in HPI   Physical Exam Triage Vital Signs ED Triage Vitals  Encounter Vitals Group     BP 12/23/22 1008 (!) 154/97  Systolic BP Percentile --      Diastolic BP Percentile --      Pulse Rate 12/23/22 1008 78     Resp 12/23/22 1008 16     Temp 12/23/22 1008 98.7 F (37.1 C)     Temp Source 12/23/22 1008 Oral     SpO2 12/23/22 1008 96 %     Weight --      Height 12/23/22 1008 5\' 3"  (1.6 m)     Head Circumference --      Peak Flow --      Pain Score 12/23/22 1007 9     Pain Loc --      Pain Education --      Exclude from Growth Chart --    No data found.  Updated Vital Signs BP (!) 154/97 (BP Location: Left Arm)   Pulse 78   Temp 98.7 F (37.1 C) (Oral)   Resp 16   Ht 5\' 3"  (1.6 m)   SpO2 96%   BMI 26.64 kg/m   Visual Acuity Right Eye Distance:   Left Eye Distance:   Bilateral Distance:    Right Eye Near:   Left  Eye Near:    Bilateral Near:     Physical Exam Vitals reviewed.  Constitutional:      Appearance: Normal appearance.  HENT:     Head: Normocephalic and atraumatic.  Eyes:     Extraocular Movements: Extraocular movements intact.     Conjunctiva/sclera: Conjunctivae normal.     Pupils: Pupils are equal, round, and reactive to light.  Cardiovascular:     Rate and Rhythm: Normal rate and regular rhythm.  Pulmonary:     Effort: Pulmonary effort is normal.     Breath sounds: Normal breath sounds.  Musculoskeletal:        General: Normal range of motion.     Right shoulder: Tenderness and bony tenderness present. No swelling, deformity or effusion. Normal range of motion. Normal strength.     Cervical back: Normal range of motion and neck supple.  Skin:    General: Skin is warm and dry.  Neurological:     General: No focal deficit present.     Mental Status: She is alert.      UC Treatments / Results  Labs (all labs ordered are listed, but only abnormal results are displayed) Labs Reviewed - No data to display  EKG   Radiology DG Shoulder Right  Result Date: 12/23/2022 CLINICAL DATA:  Following day ago landing on right shoulder. Pain with movement radiating to the axilla. Posterior right shoulder pain. EXAM: RIGHT SHOULDER - 2+ VIEW COMPARISON:  None Available. FINDINGS: Mildly decreased bone mineralization. Normal glenohumeral and acromioclavicular alignment. Mild acromioclavicular joint space narrowing and peripheral osteophytosis. No acute fracture or dislocation. The visualized portion of the right lung is unremarkable. IMPRESSION: Mild acromioclavicular osteoarthritis. Electronically Signed   By: Neita Garnet M.D.   On: 12/23/2022 12:19    Procedures Procedures (including critical care time)  Medications Ordered in UC Medications  ketorolac (TORADOL) 30 MG/ML injection 15 mg (15 mg Intramuscular Given 12/23/22 1039)    Initial Impression / Assessment and Plan / UC  Course  I have reviewed the triage vital signs and the nursing notes.  Pertinent labs & imaging results that were available during my care of the patient were reviewed by me and considered in my medical decision making (see chart for details).    Acute pain of the right shoulder secondary to  recent fall, imaging negative for any acute fracture.  Imaging shows some mild degenerative changes.  Patient received Toradol 15 mg while here in clinic.  Patient will continue home management with naproxen 375 mg twice daily as needed.  For pain management only Flexeril at bedtime.  Return precautions given if symptoms worsen or do not improve with prescribed treatment. Final Clinical Impressions(s) / UC Diagnoses   Final diagnoses:  Acute pain of right shoulder  Fall, initial encounter     Discharge Instructions      Shoulder x-ray is pending review by radiologist. I have reviewed and do not see any acute fracture or dislocation. You received Toradol which will help with inflammation. I am prescribed Flexeril for acute pain at bedtime and you can take Naproxen in 8 hours if needed for pain. Apply heating pad to shoulder and perform range of motion exercises to prevent stiffening of the shoulder or arm     ED Prescriptions     Medication Sig Dispense Auth. Provider   cyclobenzaprine (FLEXERIL) 5 MG tablet Take 2 tablets (10 mg total) by mouth at bedtime as needed for muscle spasms. 20 tablet Bing Neighbors, NP   naproxen (NAPROSYN) 375 MG tablet Take 1 tablet (375 mg total) by mouth 2 (two) times daily. 12 tablet Bing Neighbors, NP      PDMP not reviewed this encounter.   Bing Neighbors, NP 12/23/22 [phone]

## 2022-12-23 NOTE — Discharge Instructions (Addendum)
Shoulder x-ray is pending review by radiologist. I have reviewed and do not see any acute fracture or dislocation. You received Toradol which will help with inflammation. I am prescribed Flexeril for acute pain at bedtime and you can take Naproxen in 8 hours if needed for pain. Apply heating pad to shoulder and perform range of motion exercises to prevent stiffening of the shoulder or arm

## 2022-12-23 NOTE — ED Triage Notes (Signed)
Patient here today with c/o right posterior shoulder pain after falling last night. Patient states that she just fell backwards and onto her back. Patient has also noticed some swelling in her right hand but not having any hand pain.

## 2023-01-02 ENCOUNTER — Other Ambulatory Visit: Payer: Self-pay

## 2023-02-21 ENCOUNTER — Ambulatory Visit (HOSPITAL_COMMUNITY)
Admission: EM | Admit: 2023-02-21 | Discharge: 2023-02-21 | Disposition: A | Discharge status: Home / Self Care | Payer: Medicare Other | Attending: Physician Assistant | Admitting: Physician Assistant

## 2023-02-21 ENCOUNTER — Other Ambulatory Visit: Payer: Self-pay

## 2023-02-21 ENCOUNTER — Encounter (HOSPITAL_COMMUNITY): Payer: Self-pay

## 2023-02-21 DIAGNOSIS — J069 Acute upper respiratory infection, unspecified: Secondary | ICD-10-CM

## 2023-02-21 DIAGNOSIS — I10 Essential (primary) hypertension: Secondary | ICD-10-CM | POA: Diagnosis not present

## 2023-02-21 DIAGNOSIS — H00014 Hordeolum externum left upper eyelid: Secondary | ICD-10-CM

## 2023-02-21 MED ORDER — AMLODIPINE BESYLATE 10 MG PO TABS
10.0000 mg | ORAL_TABLET | Freq: Every day | ORAL | 0 refills | Status: AC
  Filled 2023-02-21: qty 30, 30d supply, fill #0

## 2023-02-21 NOTE — Discharge Instructions (Addendum)
At this I recommend that you use lubricating eyedrops such as Blink tears or refresh eyedrops.  These will help reduce irritation in your eye while the stye is healing.  The big thing to remember is to use warm compresses on the area as this will help the stye to drain and provide relief from swelling. I do not recommend using Visine drops.  You can gently wipe drainage from the eye as needed.  If you start to develop thick, yellow to green drainage from the eye please see your PCP or come back to see [place]. For your cold symptoms I recommend using over-the-counter medications as directed.  Using these consistently will help with your symptoms. I have sent in a 30-day supply of your amlodipine to help control your blood pressure as this was elevated today.  Please make sure that you go and see your PCP in the next month for further management.

## 2023-02-21 NOTE — ED Triage Notes (Signed)
Patient here today with c/o left eye swelling, drainage, and pain X 3 days.   Patient also c/o productive cough, nasal drainage, and chills X 1 week. She took an Hydrographic surveyor with no relief. Last weekend she had diarrhea. Other family members that she stays with also had a stomach bug but they are all feeling better.

## 2023-02-21 NOTE — ED Provider Notes (Signed)
[facility]    CSN: [id number] Arrival date & time: 02/21/23  0844      History   Chief Complaint Chief Complaint  Patient presents with   Eye Problem   Cough    HPI Rhonda Best is a 68 y.o. female.   HPI   Patient with hx of HTN, depression and anxiety presents with concerns for eye swelling, drainage and pain for the past 3 days  and URI symptoms for the past week  She denies any contaminants in eye or using drops of any kind She denies using contacts  She reports that several members of her family have recently recovered from stomach bug and she had diarrhea last weekend  Interventions:Alkaseltzer once last Sat      Past Medical History:  Diagnosis Date   Hypertension     Patient Active Problem List   Diagnosis Date Noted   Essential hypertension 11/12/2017   Current episode of major depressive disorder without prior episode 11/12/2017   Anxiety 11/12/2017    History reviewed. No pertinent surgical history.  OB History   No obstetric history on file.      Home Medications    Prior to Admission medications   Medication Sig Start Date End Date Taking? Authorizing Provider  amLODipine (NORVASC) 10 MG tablet TAKE 1 TABLET (10 MG TOTAL) BY MOUTH DAILY. 02/21/23 02/21/24  Avelino Herren E, PA-C  gabapentin (NEURONTIN) 100 MG capsule Take 1 capsule (100 mg total) by mouth 3 (three) times daily. Patient not taking: Reported on 02/21/2023 07/04/20 08/16/20  Rema Fendt, NP  ibuprofen (ADVIL) 600 MG tablet Take 1 tablet (600 mg total) by mouth every 8 (eight) hours as needed. 07/20/20   Rema Fendt, NP  mirtazapine (REMERON) 7.5 MG tablet Take 1 tablet (7.5 mg total) by mouth at bedtime for appetite, anxiety/depression and sleep 11/08/21     naproxen (NAPROSYN) 375 MG tablet Take 1 tablet (375 mg total) by mouth 2 (two) times daily. 12/23/22   Bing Neighbors, NP  Vitamin D, Ergocalciferol, (DRISDOL) 1.25 MG (50000 UNIT) CAPS capsule Take 1  capsule (50,000 Units total) by mouth once a week. Patient not taking: Reported on 02/21/2023 11/09/21       Family History Family History  Problem Relation Age of Onset   Healthy Mother     Social History Social History   Tobacco Use   Smoking status: Every Day    Current packs/day: 0.05    Types: Cigarettes   Smokeless tobacco: Never  Vaping Use   Vaping status: Never Used  Substance Use Topics   Alcohol use: Yes    Comment: Socially    Drug use: No     Allergies   Patient has no known allergies.   Review of Systems Review of Systems  Constitutional:  Positive for chills. Negative for fever.  HENT:  Positive for congestion. Negative for sinus pressure, sinus pain and sore throat.   Eyes:  Positive for pain, discharge and redness. Negative for visual disturbance.       Left eye swelling. She reports matting in the AM when she wakes up    Respiratory:  Positive for cough. Negative for shortness of breath and wheezing.   Gastrointestinal:  Positive for diarrhea (last weekend- has resolved at time of apt). Negative for nausea and vomiting.     Physical Exam Triage Vital Signs ED Triage Vitals  Encounter Vitals Group     BP 02/21/23 0951 (!) 181/96  Systolic BP Percentile --      Diastolic BP Percentile --      Pulse Rate 02/21/23 0951 67     Resp 02/21/23 0951 16     Temp 02/21/23 0951 98.6 F (37 C)     Temp Source 02/21/23 0951 Oral     SpO2 02/21/23 0951 98 %     Weight 02/21/23 0953 121 lb (54.9 kg)     Height 02/21/23 0953 5\' 3"  (1.6 m)     Head Circumference --      Peak Flow --      Pain Score 02/21/23 0956 7     Pain Loc --      Pain Education --      Exclude from Growth Chart --    No data found.  Updated Vital Signs BP (!) 181/96 (BP Location: Left Arm)   Pulse 67   Temp 98.6 F (37 C) (Oral)   Resp 16   Ht 5\' 3"  (1.6 m)   Wt 121 lb (54.9 kg)   SpO2 98%   BMI 21.43 kg/m   Visual Acuity Right Eye Distance:   Left Eye Distance:    Bilateral Distance:    Right Eye Near:   Left Eye Near:    Bilateral Near:     Physical Exam Vitals reviewed.  Constitutional:      General: She is awake.     Appearance: Normal appearance. She is well-developed and well-groomed.  HENT:     Head: Normocephalic and atraumatic.     Mouth/Throat:     Lips: Pink.     Mouth: Mucous membranes are moist.     Dentition: Abnormal dentition.     Pharynx: Uvula midline. Posterior oropharyngeal erythema present. No pharyngeal swelling or postnasal drip.  Eyes:     General: Lids are normal. Gaze aligned appropriately. No allergic shiner.       Left eye: Discharge and hordeolum present.    Extraocular Movements: Extraocular movements intact.     Conjunctiva/sclera: Conjunctivae normal.     Pupils: Pupils are equal, round, and reactive to light.  Pulmonary:     Effort: Pulmonary effort is normal.     Breath sounds: Normal breath sounds. No decreased air movement. No decreased breath sounds, wheezing, rhonchi or rales.  Musculoskeletal:     Cervical back: Normal range of motion.  Neurological:     Mental Status: She is alert.  Psychiatric:        Behavior: Behavior is cooperative.      UC Treatments / Results  Labs (all labs ordered are listed, but only abnormal results are displayed) Labs Reviewed - No data to display  EKG   Radiology No results found.  Procedures Procedures (including critical care time)  Medications Ordered in UC Medications - No data to display  Initial Impression / Assessment and Plan / UC Course  I have reviewed the triage vital signs and the nursing notes.  Pertinent labs & imaging results that were available during my care of the patient were reviewed by me and considered in my medical decision making (see chart for details).     BP notably elevated- will send in 30 day supply of Amlodipine and recommend follow up with PCP for continued management  Final Clinical Impressions(s) / UC Diagnoses    Final diagnoses:  Hordeolum externum of left upper eyelid  Upper respiratory tract infection, unspecified type  Hypertension, unspecified type   Hordeolum Patient reports left eye swelling and  drainage.  Physical exam is consistent with upper eyelid hordeolum.  Recommend warm compresses and lubricating eyedrops as needed for symptomatic relief URI Recommend she continues with over-the-counter medications as needed for symptomatic management. Reviewed signs and symptoms requiring follow-up. Hypertension Patient reports that she has not been seen by her PCP in some time.  Her blood pressure was significantly elevated in office today. Will send in refill of her amlodipine, 30-day quantity to allow time for her to follow-up with her PCP for ongoing management Reviewed importance of following up with her PCP for appropriate and ongoing management.  Patient is in agreement and voiced understanding     Discharge Instructions      At this I recommend that you use lubricating eyedrops such as Blink tears or refresh eyedrops.  These will help reduce irritation in your eye while the stye is healing.  The big thing to remember is to use warm compresses on the area as this will help the stye to drain and provide relief from swelling. I do not recommend using Visine drops.  You can gently wipe drainage from the eye as needed.  If you start to develop thick, yellow to green drainage from the eye please see your PCP or come back to see [place]. For your cold symptoms I recommend using over-the-counter medications as directed.  Using these consistently will help with your symptoms. I have sent in a 30-day supply of your amlodipine to help control your blood pressure as this was elevated today.  Please make sure that you go and see your PCP in the next month for further management.     ED Prescriptions     Medication Sig Dispense Auth. Provider   amLODipine (NORVASC) 10 MG tablet TAKE 1 TABLET (10 MG  TOTAL) BY MOUTH DAILY. 30 tablet Elijiah Mickley E, PA-C      PDMP not reviewed this encounter.   Anetria Harwick, Oswaldo Conroy, PA-C 02/21/23 1104

## 2023-03-04 ENCOUNTER — Other Ambulatory Visit: Payer: Self-pay
